# Patient Record
Sex: Female | Born: 1980 | Race: White | Hispanic: No | Marital: Married | Smoking: Current every day smoker
Health system: Southern US, Community
[De-identification: ages and names within clinical notes are randomized; demographics above are authoritative.]

## PROBLEM LIST (undated history)

## (undated) DIAGNOSIS — E2749 Other adrenocortical insufficiency: Secondary | ICD-10-CM

## (undated) DIAGNOSIS — F419 Anxiety disorder, unspecified: Secondary | ICD-10-CM

## (undated) DIAGNOSIS — Z91410 Personal history of adult physical and sexual abuse: Secondary | ICD-10-CM

## (undated) DIAGNOSIS — J309 Allergic rhinitis, unspecified: Secondary | ICD-10-CM

## (undated) DIAGNOSIS — K219 Gastro-esophageal reflux disease without esophagitis: Secondary | ICD-10-CM

## (undated) HISTORY — DX: Allergic rhinitis, unspecified: J30.9

## (undated) HISTORY — DX: Personal history of adult physical and sexual abuse: Z91.410

## (undated) HISTORY — PX: ELBOW SURGERY: SHX618

## (undated) HISTORY — DX: Other adrenocortical insufficiency: E27.49

## (undated) HISTORY — PX: FINGER SURGERY: SHX640

## (undated) HISTORY — DX: Gastro-esophageal reflux disease without esophagitis: K21.9

## (undated) HISTORY — PX: CHOLECYSTECTOMY: SHX55

---

## 2016-10-16 LAB — BASIC METABOLIC PANEL
BUN: 8 (ref 4–21)
Creatinine: 0.8 (ref 0.5–1.1)
Glucose: 91
Potassium: 3.3 — AB (ref 3.4–5.3)
Sodium: 139 (ref 137–147)

## 2016-10-16 LAB — VITAMIN D 25 HYDROXY (VIT D DEFICIENCY, FRACTURES): Vit D, 25-Hydroxy: 26

## 2016-10-16 LAB — HEPATIC FUNCTION PANEL
ALT: 23 (ref 7–35)
AST: 16 (ref 13–35)
Alkaline Phosphatase: 84 (ref 25–125)
Bilirubin, Total: 0.6

## 2016-10-16 LAB — LIPID PANEL
Cholesterol: 153 (ref 0–200)
HDL: 35 (ref 35–70)
LDL Cholesterol: 92
Triglycerides: 130 (ref 40–160)

## 2016-10-16 LAB — VITAMIN B12: Vitamin B-12: 388

## 2016-10-16 LAB — TSH: TSH: 2 (ref 0.41–5.90)

## 2017-04-10 ENCOUNTER — Ambulatory Visit: Payer: Self-pay | Admitting: Family Medicine

## 2017-05-01 ENCOUNTER — Ambulatory Visit: Payer: Self-pay | Admitting: Family Medicine

## 2017-05-07 ENCOUNTER — Ambulatory Visit: Payer: Self-pay | Admitting: Family Medicine

## 2017-06-07 ENCOUNTER — Ambulatory Visit: Payer: Self-pay | Admitting: Family Medicine

## 2017-07-09 ENCOUNTER — Other Ambulatory Visit: Payer: Self-pay

## 2017-07-09 ENCOUNTER — Observation Stay
Admission: EM | Admit: 2017-07-09 | Discharge: 2017-07-10 | Disposition: A | Discharge status: Home / Self Care | Payer: Self-pay | Attending: Internal Medicine | Admitting: Internal Medicine

## 2017-07-09 ENCOUNTER — Encounter: Payer: Self-pay | Admitting: Emergency Medicine

## 2017-07-09 ENCOUNTER — Emergency Department: Payer: Self-pay

## 2017-07-09 DIAGNOSIS — Z79899 Other long term (current) drug therapy: Secondary | ICD-10-CM | POA: Insufficient documentation

## 2017-07-09 DIAGNOSIS — R109 Unspecified abdominal pain: Secondary | ICD-10-CM | POA: Insufficient documentation

## 2017-07-09 DIAGNOSIS — E2749 Other adrenocortical insufficiency: Principal | ICD-10-CM | POA: Diagnosis present

## 2017-07-09 DIAGNOSIS — Z888 Allergy status to other drugs, medicaments and biological substances status: Secondary | ICD-10-CM | POA: Insufficient documentation

## 2017-07-09 DIAGNOSIS — F419 Anxiety disorder, unspecified: Secondary | ICD-10-CM | POA: Insufficient documentation

## 2017-07-09 DIAGNOSIS — Z9049 Acquired absence of other specified parts of digestive tract: Secondary | ICD-10-CM | POA: Insufficient documentation

## 2017-07-09 DIAGNOSIS — Z8249 Family history of ischemic heart disease and other diseases of the circulatory system: Secondary | ICD-10-CM | POA: Insufficient documentation

## 2017-07-09 DIAGNOSIS — R112 Nausea with vomiting, unspecified: Secondary | ICD-10-CM | POA: Insufficient documentation

## 2017-07-09 DIAGNOSIS — Z9851 Tubal ligation status: Secondary | ICD-10-CM | POA: Insufficient documentation

## 2017-07-09 HISTORY — DX: Anxiety disorder, unspecified: F41.9

## 2017-07-09 LAB — CBC
HCT: 42.3 % (ref 35.0–47.0)
Hemoglobin: 14.1 g/dL (ref 12.0–16.0)
MCH: 30 pg (ref 26.0–34.0)
MCHC: 33.4 g/dL (ref 32.0–36.0)
MCV: 89.8 fL (ref 80.0–100.0)
Platelets: 269 10*3/uL (ref 150–440)
RBC: 4.71 MIL/uL (ref 3.80–5.20)
RDW: 14.7 % — ABNORMAL HIGH (ref 11.5–14.5)
WBC: 14.5 10*3/uL — ABNORMAL HIGH (ref 3.6–11.0)

## 2017-07-09 LAB — BASIC METABOLIC PANEL
Anion gap: 10 (ref 5–15)
BUN: 11 mg/dL (ref 6–20)
CO2: 27 mmol/L (ref 22–32)
Calcium: 9.1 mg/dL (ref 8.9–10.3)
Chloride: 98 mmol/L — ABNORMAL LOW (ref 101–111)
Creatinine, Ser: 0.76 mg/dL (ref 0.44–1.00)
GFR calc Af Amer: >60 mL/min (ref >60)
GFR calc non Af Amer: >60 mL/min (ref >60)
Glucose, Bld: 97 mg/dL (ref 65–99)
Potassium: 3.1 mmol/L — ABNORMAL LOW (ref 3.5–5.1)
Sodium: 135 mmol/L (ref 135–145)

## 2017-07-09 LAB — URINALYSIS, COMPLETE (UACMP) WITH MICROSCOPIC
Bilirubin Urine: NEGATIVE
Glucose, UA: NEGATIVE mg/dL
Hgb urine dipstick: NEGATIVE
Ketones, ur: NEGATIVE mg/dL
Leukocytes, UA: NEGATIVE
Nitrite: NEGATIVE
Protein, ur: NEGATIVE mg/dL
Specific Gravity, Urine: 1.01 (ref 1.005–1.030)
pH: 6 (ref 5.0–8.0)

## 2017-07-09 LAB — LIPASE, BLOOD: Lipase: 23 U/L (ref 11–51)

## 2017-07-09 LAB — PREGNANCY, URINE: Preg Test, Ur: NEGATIVE

## 2017-07-09 LAB — TROPONIN I: Troponin I: <0.03 ng/mL (ref <0.03)

## 2017-07-09 MED ORDER — SODIUM CHLORIDE 0.9 % IV BOLUS
1000.0000 mL | Freq: Once | INTRAVENOUS | Status: AC
  Administered 2017-07-09: 1000 mL via INTRAVENOUS

## 2017-07-09 MED ORDER — IOPAMIDOL (ISOVUE-370) INJECTION 76%
75.0000 mL | Freq: Once | INTRAVENOUS | Status: AC | PRN
  Administered 2017-07-09: 75 mL via INTRAVENOUS

## 2017-07-09 MED ORDER — TRAMADOL HCL 50 MG PO TABS: 50.0000 mg | ORAL_TABLET | Freq: Four times a day (QID) | ORAL | Status: DC | PRN

## 2017-07-09 MED ORDER — ACETAMINOPHEN 325 MG PO TABS
650.0000 mg | ORAL_TABLET | Freq: Four times a day (QID) | ORAL | Status: DC | PRN
  Administered 2017-07-09 – 2017-07-10 (×2): 650 mg via ORAL
  Filled 2017-07-09 (×2): qty 2

## 2017-07-09 MED ORDER — BUSPIRONE HCL 5 MG PO TABS
10.0000 mg | ORAL_TABLET | Freq: Three times a day (TID) | ORAL | Status: DC
  Filled 2017-07-09 (×2): qty 1

## 2017-07-09 MED ORDER — ACETAMINOPHEN 650 MG RE SUPP: 650.0000 mg | Freq: Four times a day (QID) | RECTAL | Status: DC | PRN

## 2017-07-09 MED ORDER — ALBUTEROL SULFATE (2.5 MG/3ML) 0.083% IN NEBU: 2.5000 mg | INHALATION_SOLUTION | RESPIRATORY_TRACT | Status: DC | PRN

## 2017-07-09 MED ORDER — BUSPIRONE HCL 5 MG PO TABS
10.0000 mg | ORAL_TABLET | Freq: Three times a day (TID) | ORAL | Status: DC
  Administered 2017-07-09: 10 mg via ORAL
  Filled 2017-07-09 (×2): qty 2

## 2017-07-09 MED ORDER — POLYETHYLENE GLYCOL 3350 17 G PO PACK: 17.0000 g | PACK | Freq: Every day | ORAL | Status: DC | PRN

## 2017-07-09 MED ORDER — ONDANSETRON HCL 4 MG/2ML IJ SOLN
4.0000 mg | Freq: Once | INTRAMUSCULAR | Status: AC
  Administered 2017-07-09: 4 mg via INTRAVENOUS
  Filled 2017-07-09: qty 2

## 2017-07-09 MED ORDER — SODIUM CHLORIDE 0.9% FLUSH
3.0000 mL | Freq: Two times a day (BID) | INTRAVENOUS | Status: DC
  Administered 2017-07-09 – 2017-07-10 (×3): 3 mL via INTRAVENOUS

## 2017-07-09 MED ORDER — MORPHINE SULFATE (PF) 4 MG/ML IV SOLN
4.0000 mg | Freq: Once | INTRAVENOUS | Status: AC
Start: 2017-07-09 — End: 2017-07-09
  Administered 2017-07-09: 4 mg via INTRAVENOUS
  Filled 2017-07-09: qty 1

## 2017-07-09 MED ORDER — ONDANSETRON HCL 4 MG/2ML IJ SOLN: 4.0000 mg | Freq: Four times a day (QID) | INTRAMUSCULAR | Status: DC | PRN

## 2017-07-09 MED ORDER — POTASSIUM CHLORIDE CRYS ER 20 MEQ PO TBCR
40.0000 meq | EXTENDED_RELEASE_TABLET | ORAL | Status: AC
  Administered 2017-07-09 (×2): 40 meq via ORAL
  Filled 2017-07-09 (×2): qty 2

## 2017-07-09 MED ORDER — ONDANSETRON HCL 4 MG PO TABS: 4.0000 mg | ORAL_TABLET | Freq: Four times a day (QID) | ORAL | Status: DC | PRN

## 2017-07-09 MED ORDER — MORPHINE SULFATE (PF) 2 MG/ML IV SOLN: 2.0000 mg | INTRAVENOUS | Status: DC | PRN

## 2017-07-09 NOTE — H&P (Signed)
[facility] - [place] at [facility]   PATIENT NAME: Evelyn Webster    MR#:  [id number]  DATE OF BIRTH:  1980-08-22  DATE OF ADMISSION:  07/09/2017  PRIMARY CARE PHYSICIAN: Patient, No Pcp Per   REQUESTING/REFERRING PHYSICIAN: Dr. Pershing Proud  CHIEF COMPLAINT:   Chief Complaint  Patient presents with  . Abdominal Pain  . Flank Pain  . Emesis    HISTORY OF PRESENT ILLNESS:  Evelyn Webster  is a 37 y.o. female with a known history of anxiety presents to the hospital with complaints of 4 days of abdominal pain and vomiting.  Patient was seen in [facility] on Saturday and was started on Bentyl and Zofran and discharged home.  Due to worsening pain patient presented to the emergency room.  Due to elevated WBC count left foot patient had CT scan of the abdomen and pelvis which shows bilateral adrenal hemorrhages.  No other acute findings.  Patient is being admitted for observation.  No signs of hypotension or hypoglycemia at this time. No recent trauma or infections or travel.  Not on anticoagulants.  No IV drug use.  PAST MEDICAL HISTORY:   Past Medical History:  Diagnosis Date  . Anxiety     PAST SURGICAL HISTORY:   Past Surgical History:  Procedure Laterality Date  . CESAREAN SECTION    . CHOLECYSTECTOMY      SOCIAL HISTORY:   Social History   Tobacco Use  . Smoking status: Not on file  Substance Use Topics  . Alcohol use: Not on file    FAMILY HISTORY:   Family History  Problem Relation Age of Onset  . Hypertension Other     DRUG ALLERGIES:   Allergies  Allergen Reactions  . Wellbutrin [Bupropion] Rash    REVIEW OF SYSTEMS:   Review of Systems  Constitutional: Positive for malaise/fatigue. Negative for chills and fever.  HENT: Negative for sore throat.   Eyes: Negative for blurred vision, double vision and pain.  Respiratory: Negative for cough, hemoptysis, shortness of breath and wheezing.   Cardiovascular: Negative for chest pain,  palpitations, orthopnea and leg swelling.  Gastrointestinal: Positive for abdominal pain, nausea and vomiting. Negative for constipation, diarrhea and heartburn.  Genitourinary: Negative for dysuria and hematuria.  Musculoskeletal: Negative for back pain and joint pain.  Skin: Negative for rash.  Neurological: Negative for sensory change, speech change, focal weakness and headaches.  Endo/Heme/Allergies: Does not bruise/bleed easily.  Psychiatric/Behavioral: Negative for depression. The patient is nervous/anxious.     MEDICATIONS AT HOME:   Prior to Admission medications   Medication Sig Start Date End Date Taking? Authorizing Provider  busPIRone (BUSPAR) 10 MG tablet Take 10 mg by mouth 3 (three) times daily. 06/07/17  Yes [provider]  dicyclomine (BENTYL) 20 MG tablet Take 20 mg by mouth 2 (two) times daily. 07/07/17 07/17/17 Yes [provider]  ondansetron (ZOFRAN-ODT) 4 MG disintegrating tablet Take 4 mg by mouth every 8 (eight) hours as needed for nausea/vomiting. 07/07/17 07/14/17 Yes [provider]     VITAL SIGNS:  Blood pressure 119/79, pulse 71, temperature 98.1 F (36.7 C), temperature source Oral, resp. rate 13, height 5\' 1"  (1.549 m), weight 99.8 kg (220 lb), last menstrual period 06/19/2017, SpO2 96 %.  PHYSICAL EXAMINATION:  Physical Exam  GENERAL:  37 y.o.-year-old patient lying in the bed with no acute distress.  EYES: Pupils equal, round, reactive to light and accommodation. No scleral icterus. Extraocular muscles intact.  HEENT: Head atraumatic, normocephalic.  Oropharynx and nasopharynx clear. No oropharyngeal erythema, moist oral mucosa  NECK:  Supple, no jugular venous distention. No thyroid enlargement, no tenderness.  LUNGS: Normal breath sounds bilaterally, no wheezing, rales, rhonchi. No use of accessory muscles of respiration.  CARDIOVASCULAR: S1, S2 normal. No murmurs, rubs, or gallops.  ABDOMEN: Soft, tenderness diffusely,  nondistended. Bowel sounds present. No organomegaly or mass.  EXTREMITIES: No pedal edema, cyanosis, or clubbing. + 2 pedal & radial pulses b/l.   NEUROLOGIC: Cranial nerves II through XII are intact. No focal Motor or sensory deficits appreciated b/l PSYCHIATRIC: The patient is alert and oriented x 3. Good affect.  SKIN: No obvious rash, lesion, or ulcer.   LABORATORY PANEL:   CBC Recent Labs  Lab 07/09/17 0944  WBC 14.5*  HGB 14.1  HCT 42.3  PLT 269   ------------------------------------------------------------------------------------------------------------------  Chemistries  Recent Labs  Lab 07/09/17 0944  NA 135  K 3.1*  CL 98*  CO2 27  GLUCOSE 97  BUN 11  CREATININE 0.76  CALCIUM 9.1   ------------------------------------------------------------------------------------------------------------------  Cardiac Enzymes Recent Labs  Lab 07/09/17 0944  TROPONINI <0.03   ------------------------------------------------------------------------------------------------------------------  RADIOLOGY:  Ct Abdomen Pelvis W Contrast  Result Date: 07/09/2017 CLINICAL DATA:  Mid abdominal pain EXAM: CT ABDOMEN AND PELVIS WITH CONTRAST TECHNIQUE: Multidetector CT imaging of the abdomen and pelvis was performed using the standard protocol following bolus administration of intravenous contrast. CONTRAST:  75mL [id number] COMPARISON:  None. FINDINGS: Lower chest: No acute abnormality. Hepatobiliary: No focal liver abnormality is seen. Status post cholecystectomy. No biliary dilatation. Pancreas: Unremarkable. No pancreatic ductal dilatation or surrounding inflammatory changes. Spleen: Normal in size without focal abnormality. Adrenals/Urinary Tract: The right adrenal gland demonstrates hyperplasia. Some mild stranding is noted adjacent to the right adrenal gland. The left adrenal gland demonstrates a focal mass lesion measuring approximately 4.5 cm. Some central areas of decreased  attenuation are identified within the mass as well as some inflammatory change surrounding the adrenal gland and extending inferiorly along the left kidney and Gerota's fascia. The kidneys are within normal limits without obstructive change. The bladder is decompressed. Stomach/Bowel: The appendix is not visualized and may have been surgically removed. Correlation with patient's history is recommended. No obstructive or inflammatory changes of the bowel are seen. Vascular/Lymphatic: No significant vascular findings are present. No enlarged abdominal or pelvic lymph nodes. Reproductive: Uterus and bilateral adnexa are unremarkable. Changes of prior tubal ligation are seen. Other: No abdominal wall hernia or abnormality. No abdominopelvic ascites. Musculoskeletal: No acute or significant osseous findings. IMPRESSION: Changes involving the adrenal glands bilaterally with some periadrenal stranding and significant enlargement of the left adrenal gland. This likely represents acute adrenal hemorrhage. The possibility of underlying mass deserves consideration and follow-up MRI examination in 4-6 weeks may be helpful. Additionally correlation with adrenal laboratory values is recommended to assess for the degree of adrenal function. No other focal abnormality is seen. Critical Value/emergent results were called by telephone at the time of interpretation on 07/09/2017 at 2:17 pm to Dr. Lamont Snowball , who verbally acknowledged these results. Electronically Signed   By: Alcide Clever M.D.   On: 07/09/2017 14:17     IMPRESSION AND PLAN:   * Adrenal hemorrhage, bilateral Etiology unclear.  No signs of infection or trauma or anticoagulants.  Will check blood cultures.  We will also check cortisol level.  Monitor her for hypotension or adrenal insufficiency.  Check HIV.  Vascular surgery consulted from emergency room.  Monitor.  If any worsening will need  embolization. Need close outpatient follow-up even after discharge for  adrenal insufficiency.  *Abdominal pain and nausea likely due to adrenal hemorrhage.  Anxiety.  Continue medications.  DVT prophylaxis with SCDs  All the records are reviewed and case discussed with ED provider. Management plans discussed with the patient, family and they are in agreement.  CODE STATUS: FULL CODE  TOTAL TIME TAKING CARE OF THIS PATIENT: 40 minutes.   Molinda Bailiff Asani Deniston M.D on 07/09/2017 at 3:27 PM  Between 7am to 6pm - Pager - [phone]  After 6pm go to [email or web address] - password [email or web address]  [facility] [place] [facility]  Office  [phone]  CC: Primary care physician; Patient, No Pcp Per  Note: This dictation was prepared with Dragon dictation along with smaller phrase technology. Any transcriptional errors that result from this process are unintentional.

## 2017-07-09 NOTE — ED Triage Notes (Signed)
Pt reports was seen at a hospital this weekend for mid epigastric pain and was given medication. Pt reports now with pain to left flank and NV.

## 2017-07-09 NOTE — ED Notes (Addendum)
Pt c/o mid abd pain that started Friday and persisted into Saturday with N/V - yesterday she started with mid abd pain that radiated into left lower abd - denies diarrhea - reports hx of heartburn being tx'd with medication daily - reports that when she vomits it is a "hot liquid" with burning sensation

## 2017-07-09 NOTE — ED Provider Notes (Addendum)
[facility] Emergency Department Provider Note  ____________________________________________   First MD Initiated Contact with Patient 07/09/17 1110     (approximate)  I have reviewed the triage vital signs and the nursing notes.   HISTORY  Chief Complaint Abdominal Pain; Flank Pain; and Emesis   HPI Evelyn Webster is a 37 y.o. female history of cholecystectomy who presented to an outside emergency department this past Saturday for epigastric abdominal pain was presented to the emergency department today with left flank pain.  Says the pain is sharp and crampy and is a 4-6 out of 10 at this time.  She says that she has nausea and is vomiting thin fluids but not any solids.  Denies any diarrhea.  Was discharged with a diagnosis of "virus" with prescription for dicyclomine, Zofran and Maalox.  However, the pain has migrated down to the left flank.  Patient says that she was in severe pain yesterday and is returning today because of the migration of the pain in addition to the worsening.  She denies any dysuria.  Denies any history of kidney stones.  History reviewed. No pertinent past medical history.  There are no active problems to display for this patient.   Past Surgical History:  Procedure Laterality Date  . CESAREAN SECTION    . CHOLECYSTECTOMY      Prior to Admission medications   Medication Sig Start Date End Date Taking? Authorizing Provider  busPIRone (BUSPAR) 10 MG tablet Take 10 mg by mouth 3 (three) times daily. 06/07/17  Yes [provider]  dicyclomine (BENTYL) 20 MG tablet Take 20 mg by mouth 2 (two) times daily. 07/07/17 07/17/17 Yes [provider]  ondansetron (ZOFRAN-ODT) 4 MG disintegrating tablet Take 4 mg by mouth every 8 (eight) hours as needed for nausea/vomiting. 07/07/17 07/14/17 Yes [provider]    Allergies Wellbutrin [bupropion]  No family history on file.  Social History Social History   Tobacco  Use  . Smoking status: Not on file  Substance Use Topics  . Alcohol use: Not on file  . Drug use: Not on file    Review of Systems  Constitutional: No fever/chills Eyes: No visual changes. ENT: No sore throat. Cardiovascular: Denies chest pain. Respiratory: Denies shortness of breath. Gastrointestinal:no vomiting.  No diarrhea.  No constipation. Genitourinary: Negative for dysuria. Musculoskeletal: Negative for back pain. Skin: Negative for rash. Neurological: Negative for headaches, focal weakness or numbness.   ____________________________________________   PHYSICAL EXAM:  VITAL SIGNS: ED Triage Vitals [07/09/17 0932]  Enc Vitals Group     BP (!) 131/96     Pulse Rate (!) 104     Resp 20     Temp 98.1 F (36.7 C)     Temp Source Oral     SpO2 100 %     Weight 220 lb (99.8 kg)     Height  (1.549 m)     Head Circumference      Peak Flow      Pain Score 4     Pain Loc      Pain Edu?      Excl. in GC?     Constitutional: Alert and oriented. Well appearing and in no acute distress. Eyes: Conjunctivae are normal.  Head: Atraumatic. Nose: No congestion/rhinnorhea. Mouth/Throat: Mucous membranes are moist.  Neck: No stridor.   Cardiovascular: Normal rate, regular rhythm. Grossly normal heart sounds.  Good peripheral circulation. Respiratory: Normal respiratory effort.  No retractions. Lungs CTAB. Gastrointestinal:  Soft with mild left epigastric, left upper quadrant as well as left lower quadrant tenderness.  There is no right lower quadrant or right upper quadrant tenderness to palpation. No distention. No CVA tenderness. Musculoskeletal: No lower extremity tenderness nor edema.  No joint effusions. Neurologic:  Normal speech and language. No gross focal neurologic deficits are appreciated. Skin:  Skin is warm, dry and intact. No rash noted. Psychiatric: Mood and affect are normal. Speech and behavior are  normal.  ____________________________________________   LABS (all labs ordered are listed, but only abnormal results are displayed)  Labs Reviewed  URINALYSIS, COMPLETE (UACMP) WITH MICROSCOPIC - Abnormal; Notable for the following components:      Result Value   Color, Urine YELLOW (*)    APPearance HAZY (*)    Bacteria, UA MANY (*)    All other components within normal limits  BASIC METABOLIC PANEL - Abnormal; Notable for the following components:   Potassium 3.1 (*)    Chloride 98 (*)    All other components within normal limits  CBC - Abnormal; Notable for the following components:   WBC 14.5 (*)    RDW 14.7 (*)    All other components within normal limits  LIPASE, BLOOD  TROPONIN I  PREGNANCY, URINE   ____________________________________________  EKG  ED ECG REPORT I, Arelia Longest, the attending physician, personally viewed and interpreted this ECG.   Date: 07/09/2017  EKG Time: 1202  Rate: 81  Rhythm: normal sinus rhythm  Axis: Normal  Intervals:none  ST&T Change: No ST segment elevation or depression.  No abnormal T wave inversion.  ____________________________________________  RADIOLOGY  Patient with bilateral adrenal hemorrhages. ____________________________________________   PROCEDURES  Procedure(s) performed:   Procedures  Critical Care performed:   ____________________________________________   INITIAL IMPRESSION / ASSESSMENT AND PLAN / ED COURSE  Pertinent labs & imaging results that were available during my care of the patient were reviewed by me and considered in my medical decision making (see chart for details).  Differential diagnosis includes, but is not limited to, ovarian cyst, ovarian torsion, acute appendicitis, diverticulitis, urinary tract infection/pyelonephritis, endometriosis, bowel obstruction, colitis, renal colic, gastroenteritis, hernia, fibroids, endometriosis, pregnancy related pain including ectopic pregnancy,  etc. As part of my medical decision making, I reviewed the following data within the electronic MEDICAL RECORD NUMBERI reviewed the patient's medical records from this past Saturday's visit which included a negative pregnancy test.  White blood cell count at that point was 17.9.  It is down to 14 today.  ----------------------------------------- 3:09 PM on 07/09/2017 -----------------------------------------  Patient without distress at this time.  Notified her of the CT findings.  I also discussed the case with Dr. Wyn Quaker of the vascular surgery who recommends admission with serial hemoglobins.  He says that if the patient were to deteriorate with a hemoglobin were to drop that he could embolize the adrenal glands.  Signed out to Dr. Cherlynn Kaiser.  Unclear etiology of the patient's adrenal hemorrhage.  No history of trauma.  No recent sepsis/meningococcemia. ____________________________________________   FINAL CLINICAL IMPRESSION(S) / ED DIAGNOSES  Adrenal gland hemorrhage.    NEW MEDICATIONS STARTED DURING THIS VISIT:  New Prescriptions   No medications on file     Note:  This document was prepared using Dragon voice recognition software and may include unintentional dictation errors.     Myrna Blazer, MD 07/09/17 1510    Pershing Proud, Myra Rude, MD 07/09/17 [phone]

## 2017-07-09 NOTE — ED Notes (Signed)
Attempted IV in right upper arm x1 without success

## 2017-07-09 NOTE — ED Notes (Signed)
Pt given water to drink. 

## 2017-07-09 NOTE — ED Notes (Signed)
Lab called to check on urine preg - they had not started to process it yet and stated they would run it now

## 2017-07-10 LAB — CBC
HCT: 36.3 % (ref 35.0–47.0)
Hemoglobin: 12.2 g/dL (ref 12.0–16.0)
MCH: 30.2 pg (ref 26.0–34.0)
MCHC: 33.5 g/dL (ref 32.0–36.0)
MCV: 90.1 fL (ref 80.0–100.0)
Platelets: 242 10*3/uL (ref 150–440)
RBC: 4.03 MIL/uL (ref 3.80–5.20)
RDW: 14.3 % (ref 11.5–14.5)
WBC: 8.5 10*3/uL (ref 3.6–11.0)

## 2017-07-10 LAB — HEMOGLOBIN: Hemoglobin: 12.4 g/dL (ref 12.0–16.0)

## 2017-07-10 LAB — BASIC METABOLIC PANEL
Anion gap: 6 (ref 5–15)
BUN: 11 mg/dL (ref 6–20)
CO2: 26 mmol/L (ref 22–32)
Calcium: 8.3 mg/dL — ABNORMAL LOW (ref 8.9–10.3)
Chloride: 107 mmol/L (ref 101–111)
Creatinine, Ser: 0.83 mg/dL (ref 0.44–1.00)
GFR calc Af Amer: >60 mL/min (ref >60)
GFR calc non Af Amer: >60 mL/min (ref >60)
Glucose, Bld: 121 mg/dL — ABNORMAL HIGH (ref 65–99)
Potassium: 3.7 mmol/L (ref 3.5–5.1)
Sodium: 139 mmol/L (ref 135–145)

## 2017-07-10 LAB — CORTISOL: Cortisol, Plasma: 6.8 ug/dL

## 2017-07-10 MED ORDER — ACETAMINOPHEN 325 MG PO TABS: 650.0000 mg | ORAL_TABLET | Freq: Four times a day (QID) | ORAL | 0 refills | Status: AC | PRN

## 2017-07-10 MED ORDER — BUSPIRONE HCL 5 MG PO TABS
5.0000 mg | ORAL_TABLET | Freq: Three times a day (TID) | ORAL | Status: DC
  Administered 2017-07-10: 5 mg via ORAL
  Filled 2017-07-10 (×3): qty 1

## 2017-07-10 NOTE — Discharge Instructions (Signed)
Check blood sugar and Blood pressure daily at home and visit PMD in 1 week.

## 2017-07-10 NOTE — Progress Notes (Signed)
Pt discharged per MD order. IV removed. Discharge instructions reviewed with pt and pt verbalized understanding. Work note given to pt. Pt taken to car in wheelchair by staff.

## 2017-07-10 NOTE — Discharge Summary (Signed)
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 Anxiety     HOSPITAL COURSE:   Patient was admitted for bilateral adrenal bleed and pain. Her hemoglobin remained stable on further follow ups. She required tylenol tablets twice since last night. I spoke to vascular surgeon on call, he had suggested- to have follow-up in clinic in 1 month. Patient's blood sugar and blood pressure is stable. I advised to keep checking her blood sugar and blood pressure at home every day. Follow-up with primary care physician in 1 week.  DISCHARGE CONDITIONS:   Stable.  CONSULTS OBTAINED:  Treatment Team:  Renford Dills, MD  DRUG ALLERGIES:   Allergies  Allergen Reactions  . Wellbutrin [Bupropion] Rash    DISCHARGE MEDICATIONS:   Allergies as of 07/10/2017      Reactions   Wellbutrin [bupropion] Rash      Medication List    TAKE these medications   acetaminophen 325 MG tablet Commonly known as:  TYLENOL Take 2 tablets (650 mg total) by mouth every 6 (six) hours as needed for mild pain (or Fever >/= 101).   busPIRone 10 MG tablet Commonly known as:  BUSPAR Take 10 mg by mouth 3 (three) times daily.   dicyclomine 20 MG tablet Commonly known as:  BENTYL Take 20 mg by mouth 2 (two) times daily.   ondansetron 4 MG disintegrating tablet Commonly known as:  ZOFRAN-ODT Take 4 mg by mouth every 8 (eight) hours as needed for nausea/vomiting.        DISCHARGE INSTRUCTIONS:    Follow with PMD and vascular clinic in 1 week.  If you experience  worsening of your admission symptoms, develop shortness of breath, life threatening emergency, suicidal or homicidal thoughts you must seek medical attention immediately by calling 911 or calling your MD immediately  if symptoms less severe.  You Must read complete instructions/literature along with all the possible adverse reactions/side effects for all the Medicines you take and that have been prescribed to you. Take any new Medicines after you have completely understood and accept all the possible adverse reactions/side effects.   Please note  You were cared for by a hospitalist during your hospital stay. If you have any questions about your discharge medications or the care you received while you were in the hospital after you are discharged, you can call the unit and asked to speak with the hospitalist on call if the hospitalist that took care of you is not available. Once you are discharged, your primary care physician will handle any further medical issues. Please note that NO REFILLS for any discharge medications will be authorized once you are discharged, as it is imperative that you return to your primary care physician (or establish a relationship with a primary care physician if you do not have one) for your aftercare needs so that they can reassess your need for medications and monitor your lab values.    Today   CHIEF COMPLAINT:   Chief Complaint  Patient presents with  . Abdominal Pain  . Flank  Pain  . Emesis    HISTORY OF PRESENT ILLNESS:  Evelyn Webster  is a 37 y.o. female with complaints of 4 days of abdominal pain and vomiting.  Patient was seen in [facility] on Saturday and was started on Bentyl and Zofran and discharged home.  Due to worsening pain patient presented to the emergency room.  Due to elevated WBC count left foot patient had CT scan of the abdomen and pelvis which shows bilateral adrenal hemorrhages.  No other acute findings.  Patient is being admitted for  observation.  No signs of hypotension or hypoglycemia at this time. No recent trauma or infections or travel.  Not on anticoagulants.  No IV drug use.   VITAL SIGNS:  Blood pressure 106/86, pulse 62, temperature 98.2 F (36.8 C), resp. rate 17, height  (1.549 m), weight 99.8 kg (220 lb), last menstrual period 07/09/2017, SpO2 98 %.  I/O:    Intake/Output Summary (Last 24 hours) at 07/10/2017 1455 Last data filed at 07/10/2017 0400 Gross per 24 hour  Intake 520 ml  Output 500 ml  Net 20 ml    PHYSICAL EXAMINATION:  GENERAL:  38 y.o.-year-old patient lying in the bed with no acute distress.  EYES: Pupils equal, round, reactive to light and accommodation. No scleral icterus. Extraocular muscles intact.  HEENT: Head atraumatic, normocephalic. Oropharynx and nasopharynx clear.  NECK:  Supple, no jugular venous distention. No thyroid enlargement, no tenderness.  LUNGS: Normal breath sounds bilaterally, no wheezing, rales,rhonchi or crepitation. No use of accessory muscles of respiration.  CARDIOVASCULAR: S1, S2 normal. No murmurs, rubs, or gallops.  ABDOMEN: Soft, non-tender, non-distended. Bowel sounds present. No organomegaly or mass.  EXTREMITIES: No pedal edema, cyanosis, or clubbing.  NEUROLOGIC: Cranial nerves II through XII are intact. Muscle strength 5/5 in all extremities. Sensation intact. Gait not checked.  PSYCHIATRIC: The patient is alert and oriented x 3.  SKIN: No obvious rash, lesion, or ulcer.   DATA REVIEW:   CBC Recent Labs  Lab 07/10/17 0616 07/10/17 1136  WBC 8.5  --   HGB 12.2 12.4  HCT 36.3  --   PLT 242  --     Chemistries  Recent Labs  Lab 07/10/17 0616  NA 139  K 3.7  CL 107  CO2 26  GLUCOSE 121*  BUN 11  CREATININE 0.83  CALCIUM 8.3*    Cardiac Enzymes Recent Labs  Lab 07/09/17 0944  TROPONINI <0.03    Microbiology Results  Results for orders placed or performed during the hospital encounter of 07/09/17  CULTURE, BLOOD (ROUTINE  X 2) w Reflex to ID Panel     Status: None (Preliminary result)   Collection Time: 07/09/17  7:25 PM  Result Value Ref Range Status   Specimen Description BLOOD BLOOD RIGHT HAND  Final   Special Requests   Final    BOTTLES DRAWN AEROBIC AND ANAEROBIC Blood Culture results may not be optimal due to an inadequate volume of blood received in culture bottles   Culture   Final    NO GROWTH < 12 HOURS Performed at [facility], [address] [place], [place] [address]    Report Status PENDING  Incomplete  CULTURE, BLOOD (ROUTINE X 2) w Reflex to ID Panel     Status: None (Preliminary result)   Collection Time: 07/09/17  7:34 PM  Result Value Ref Range Status   Specimen Description BLOOD BLOOD LEFT FOREARM  Final   Special Requests   Final  BOTTLES DRAWN AEROBIC AND ANAEROBIC Blood Culture results may not be optimal due to an inadequate volume of blood received in culture bottles   Culture   Final    NO GROWTH < 12 HOURS Performed at [facility], [address] [place], [place] [address]    Report Status PENDING  Incomplete    RADIOLOGY:  Ct Abdomen Pelvis W Contrast  Result Date: 07/09/2017 CLINICAL DATA:  Mid abdominal pain EXAM: CT ABDOMEN AND PELVIS WITH CONTRAST TECHNIQUE: Multidetector CT imaging of the abdomen and pelvis was performed using the standard protocol following bolus administration of intravenous contrast. CONTRAST:  75mL [id number] COMPARISON:  None. FINDINGS: Lower chest: No acute abnormality. Hepatobiliary: No focal liver abnormality is seen. Status post cholecystectomy. No biliary dilatation. Pancreas: Unremarkable. No pancreatic ductal dilatation or surrounding inflammatory changes. Spleen: Normal in size without focal abnormality. Adrenals/Urinary Tract: The right adrenal gland demonstrates hyperplasia. Some mild stranding is noted adjacent to the right adrenal gland. The left adrenal gland demonstrates a focal mass lesion measuring approximately  4.5 cm. Some central areas of decreased attenuation are identified within the mass as well as some inflammatory change surrounding the adrenal gland and extending inferiorly along the left kidney and Gerota's fascia. The kidneys are within normal limits without obstructive change. The bladder is decompressed. Stomach/Bowel: The appendix is not visualized and may have been surgically removed. Correlation with patient's history is recommended. No obstructive or inflammatory changes of the bowel are seen. Vascular/Lymphatic: No significant vascular findings are present. No enlarged abdominal or pelvic lymph nodes. Reproductive: Uterus and bilateral adnexa are unremarkable. Changes of prior tubal ligation are seen. Other: No abdominal wall hernia or abnormality. No abdominopelvic ascites. Musculoskeletal: No acute or significant osseous findings. IMPRESSION: Changes involving the adrenal glands bilaterally with some periadrenal stranding and significant enlargement of the left adrenal gland. This likely represents acute adrenal hemorrhage. The possibility of underlying mass deserves consideration and follow-up MRI examination in 4-6 weeks may be helpful. Additionally correlation with adrenal laboratory values is recommended to assess for the degree of adrenal function. No other focal abnormality is seen. Critical Value/emergent results were called by telephone at the time of interpretation on 07/09/2017 at 2:17 pm to Dr. Lamont Snowball , who verbally acknowledged these results. Electronically Signed   By: Alcide Clever M.D.   On: 07/09/2017 14:17    EKG:   Orders placed or performed during the hospital encounter of 07/09/17  . ED EKG  . ED EKG  . EKG 12-Lead  . EKG 12-Lead      Management plans discussed with the patient, family and they are in agreement.  CODE STATUS:     Code Status Orders  (From admission, onward)        Start     Ordered   07/09/17 1523  Full code  Continuous     07/09/17 1524     Code Status History    This patient has a current code status but no historical code status.      TOTAL TIME TAKING CARE OF THIS PATIENT: 35 minutes.    Altamese Dilling M.D on 07/10/2017 at 2:55 PM  Between 7am to 6pm - Pager - [phone]  After 6pm go to [email or web address] - password [email or web address]  [facility]  Office  [phone]  CC: Primary care physician; Patient, No Pcp Per   Note: This dictation was prepared with Dragon dictation along with smaller phrase technology. Any transcriptional errors that result from this process are  unintentional.

## 2017-07-10 NOTE — Progress Notes (Signed)
[facility]         [place], [place].   07/10/2017  Patient: Evelyn Webster   Date of Birth:  [birth year]  Date of admission:  07/09/2017  Date of Discharge  07/10/2017    To Whom it May Concern:   Evelyn Webster  may return to work on 07/13/17.  PHYSICAL ACTIVITY:  Full  If you have any questions or concerns, please don't hesitate to call.  Sincerely,   Altamese Dilling M.D Pager [phone] Office : [phone]   .

## 2017-07-11 LAB — HIV ANTIBODY (ROUTINE TESTING W REFLEX): HIV Screen 4th Generation wRfx: NONREACTIVE

## 2017-07-12 ENCOUNTER — Ambulatory Visit (INDEPENDENT_AMBULATORY_CARE_PROVIDER_SITE_OTHER): Payer: Self-pay | Admitting: Family Medicine

## 2017-07-12 VITALS — BP 120/79 | HR 77 | Temp 98.3°F | Ht 61.0 in | Wt 208.5 lb

## 2017-07-12 DIAGNOSIS — F410 Panic disorder [episodic paroxysmal anxiety] without agoraphobia: Secondary | ICD-10-CM | POA: Insufficient documentation

## 2017-07-12 DIAGNOSIS — D72829 Elevated white blood cell count, unspecified: Secondary | ICD-10-CM

## 2017-07-12 DIAGNOSIS — F411 Generalized anxiety disorder: Secondary | ICD-10-CM

## 2017-07-12 DIAGNOSIS — E2749 Other adrenocortical insufficiency: Secondary | ICD-10-CM

## 2017-07-12 DIAGNOSIS — R739 Hyperglycemia, unspecified: Secondary | ICD-10-CM

## 2017-07-12 LAB — BAYER DCA HB A1C WAIVED: HB A1C (BAYER DCA - WAIVED): 5.5 % (ref <7.0)

## 2017-07-12 MED ORDER — BUSPIRONE HCL 10 MG PO TABS: 10.0000 mg | ORAL_TABLET | Freq: Three times a day (TID) | ORAL | 2 refills | Status: DC

## 2017-07-12 NOTE — Patient Instructions (Signed)
Steps to Quit Smoking Smoking tobacco can be harmful to your health and can affect almost every organ in your body. Smoking puts you, and those around you, at risk for developing many serious chronic diseases. Quitting smoking is difficult, but it is one of the best things that you can do for your health. It is never too late to quit. What are the benefits of quitting smoking? When you quit smoking, you lower your risk of developing serious diseases and conditions, such as:  Lung cancer or lung disease, such as COPD.  Heart disease.  Stroke.  Heart attack.  Infertility.  Osteoporosis and bone fractures.  Additionally, symptoms such as coughing, wheezing, and shortness of breath may get better when you quit. You may also find that you get sick less often because your body is stronger at fighting off colds and infections. If you are pregnant, quitting smoking can help to reduce your chances of having a baby of low birth weight. How do I get ready to quit? When you decide to quit smoking, create a plan to make sure that you are successful. Before you quit:  Pick a date to quit. Set a date within the next two weeks to give you time to prepare.  Write down the reasons why you are quitting. Keep this list in places where you will see it often, such as on your bathroom mirror or in your car or wallet.  Identify the people, places, things, and activities that make you want to smoke (triggers) and avoid them. Make sure to take these actions: ? Throw away all cigarettes at home, at work, and in your car. ? Throw away smoking accessories, such as ashtrays and lighters. ? Clean your car and make sure to empty the ashtray. ? Clean your home, including curtains and carpets.  Tell your family, friends, and coworkers that you are quitting. Support from your loved ones can make quitting easier.  Talk with your health care provider about your options for quitting smoking.  Find out what treatment  options are covered by your health insurance.  What strategies can I use to quit smoking? Talk with your healthcare provider about different strategies to quit smoking. Some strategies include:  Quitting smoking altogether instead of gradually lessening how much you smoke over a period of time. Research shows that quitting "cold turkey" is more successful than gradually quitting.  Attending in-person counseling to help you build problem-solving skills. You are more likely to have success in quitting if you attend several counseling sessions. Even short sessions of 10 minutes can be effective.  Finding resources and support systems that can help you to quit smoking and remain smoke-free after you quit. These resources are most helpful when you use them often. They can include: ? Online chats with a counselor. ? Telephone quitlines. ? Printed self-help materials. ? Support groups or group counseling. ? Text messaging programs. ? Mobile phone applications.  Taking medicines to help you quit smoking. (If you are pregnant or breastfeeding, talk with your health care provider first.) Some medicines contain nicotine and some do not. Both types of medicines help with cravings, but the medicines that include nicotine help to relieve withdrawal symptoms. Your health care provider may recommend: ? Nicotine patches, gum, or lozenges. ? Nicotine inhalers or sprays. ? Non-nicotine medicine that is taken by mouth.  Talk with your health care provider about combining strategies, such as taking medicines while you are also receiving in-person counseling. Using these two strategies together   makes you more likely to succeed in quitting than if you used either strategy on its own. If you are pregnant or breastfeeding, talk with your health care provider about finding counseling or other support strategies to quit smoking. Do not take medicine to help you quit smoking unless told to do so by your health care  provider. What things can I do to make it easier to quit? Quitting smoking might feel overwhelming at first, but there is a lot that you can do to make it easier. Take these important actions:  Reach out to your family and friends and ask that they support and encourage you during this time. Call telephone quitlines, reach out to support groups, or work with a counselor for support.  Ask people who smoke to avoid smoking around you.  Avoid places that trigger you to smoke, such as bars, parties, or smoke-break areas at work.  Spend time around people who do not smoke.  Lessen stress in your life, because stress can be a smoking trigger for some people. To lessen stress, try: ? Exercising regularly. ? Deep-breathing exercises. ? Yoga. ? Meditating. ? Performing a body scan. This involves closing your eyes, scanning your body from head to toe, and noticing which parts of your body are particularly tense. Purposefully relax the muscles in those areas.  Download or purchase mobile phone or tablet apps (applications) that can help you stick to your quit plan by providing reminders, tips, and encouragement. There are many free apps, such as QuitGuide from the CDC (Centers for Disease Control and Prevention). You can find other support for quitting smoking (smoking cessation) through smokefree.gov and other websites.  How will I feel when I quit smoking? Within the first 24 hours of quitting smoking, you may start to feel some withdrawal symptoms. These symptoms are usually most noticeable 2-3 days after quitting, but they usually do not last beyond 2-3 weeks. Changes or symptoms that you might experience include:  Mood swings.  Restlessness, anxiety, or irritation.  Difficulty concentrating.  Dizziness.  Strong cravings for sugary foods in addition to nicotine.  Mild weight gain.  Constipation.  Nausea.  Coughing or a sore throat.  Changes in how your medicines work in your  body.  A depressed mood.  Difficulty sleeping (insomnia).  After the first 2-3 weeks of quitting, you may start to notice more positive results, such as:  Improved sense of smell and taste.  Decreased coughing and sore throat.  Slower heart rate.  Lower blood pressure.  Clearer skin.  The ability to breathe more easily.  Fewer sick days.  Quitting smoking is very challenging for most people. Do not get discouraged if you are not successful the first time. Some people need to make many attempts to quit before they achieve long-term success. Do your best to stick to your quit plan, and talk with your health care provider if you have any questions or concerns. This information is not intended to replace advice given to you by your health care provider. Make sure you discuss any questions you have with your health care provider. Document Released: 02/14/2001 Document Revised: 10/19/2015 Document Reviewed: 07/07/2014 Elsevier Interactive Patient Education  2018 [employer] Inc.  

## 2017-07-12 NOTE — Assessment & Plan Note (Signed)
Not under good control. Unclear if she has ever been on preventative medicine. Will obtain records from former PCP and get her back in 2-3 weeks for further discussion. Call with any concerns.

## 2017-07-12 NOTE — Progress Notes (Signed)
BP 120/79 (BP Location: Right Arm, Patient Position: Sitting, Cuff Size: Normal)   Pulse 77   Temp 98.3 F (36.8 C) (Oral)   Ht  (1.549 m)   Wt 208 lb 8 oz (94.6 kg)   LMP 07/09/2017   SpO2 99%   BMI 39.40 kg/m    Subjective:    Patient ID: Evelyn Webster, female    DOB: 02-Jul-1980, 37 y.o.   MRN: [id number]  HPI: Evelyn Webster is a 37 y.o. female who presents today to establish care, last saw a doctor within a year  Chief Complaint  Patient presents with  . Hospitalization Follow-up  . Establish Care   ER FOLLOW UP Time since discharge: 5 days Hospital/facility: [facility] ER Diagnosis: Abdominal Pain of unknown cause Procedures/tests: Labs- elevated WBC at 17.9, otherwise normal Consultants: None New medications: Toradol and GI cocktail Discharge instructions:  Follow up here Status: worse on Saturday  HOSPITAL FOLLOW UP- Returned to the ER with worsening abdominal pain that was radiating down to her L flank, diagnosed with bilateral adrenal bleed and pain. Hemoglobin remained stable. Tylenol controled pain.  Time since discharge: 3 days Hospital/facility: [facility] Diagnosis: Bilateral Adrenal hemorrhage Procedures/tests: labs (WBC down to 14), EKG, CT abdomen- showed bilateral adrenal hemorrhages Consultants: Dr. Wyn Quaker New medications: tylenol, buspar, bentyl, zofran Discharge instructions:  Follow up here with with vascular surgeon in 1 month. Keep log of BP and sugar Status: better    Date discharged? 07/10/2017   How have you been since you were released from the hospital? better   Do you understand why you were in the hospital? yes   Do you understand the discharge instructions? yes   Where were you discharged to? Home    Items Reviewed:  Medications reviewed: yes  Allergies reviewed: yes  Dietary changes reviewed: yes  Referrals reviewed: yes   Functional Questionnaire:   Activities of Daily Living (ADLs):   She states they are  independent in the following: ambulation, bathing and hygiene, feeding, continence, grooming, toileting and dressing States they require assistance with the following: none   Any transportation issues/concerns?: no   Any patient concerns? no  Transition of Care Hospital Follow up.   Hospital/Facility: [facility] D/C Physician: Altamese Dilling, MD D/C Date: 07/10/17  Records Requested: 07/12/17 Records Received: 07/12/17 Records Reviewed: 07/12/17  Diagnoses on Discharge:   Date of interactive Contact within 48 hours of discharge: 07/12/17 Contact was through: direct  Date of 7 day or 14 day face-to-face visit:  07/12/17  within 7 days  Outpatient Encounter Medications as of 07/12/2017  Medication Sig  . acetaminophen (TYLENOL) 325 MG tablet Take 2 tablets (650 mg total) by mouth every 6 (six) hours as needed for mild pain (or Fever >/= 101).  . busPIRone (BUSPAR) 10 MG tablet Take 1 tablet (10 mg total) by mouth 3 (three) times daily.  Marland Kitchen dicyclomine (BENTYL) 20 MG tablet Take 20 mg by mouth 2 (two) times daily.  . Omega-3 Fatty Acids (FISH OIL PO) Take 1 Dose by mouth as directed.  . ondansetron (ZOFRAN-ODT) 4 MG disintegrating tablet Take 4 mg by mouth every 8 (eight) hours as needed for nausea/vomiting.  . [DISCONTINUED] busPIRone (BUSPAR) 10 MG tablet Take 10 mg by mouth 3 (three) times daily.   No facility-administered encounter medications on file as of 07/12/2017.     Diagnostic Tests Reviewed/Disposition:   CLINICAL DATA:  Mid abdominal pain  EXAM: CT ABDOMEN AND PELVIS WITH CONTRAST  TECHNIQUE: Multidetector CT imaging  of the abdomen and pelvis was performed using the standard protocol following bolus administration of intravenous contrast.  CONTRAST:  75mL [id number]  COMPARISON:  None.  FINDINGS: Lower chest: No acute abnormality.  Hepatobiliary: No focal liver abnormality is seen. Status post cholecystectomy. No biliary dilatation.  Pancreas:  Unremarkable. No pancreatic ductal dilatation or surrounding inflammatory changes.  Spleen: Normal in size without focal abnormality.  Adrenals/Urinary Tract: The right adrenal gland demonstrates hyperplasia. Some mild stranding is noted adjacent to the right adrenal gland. The left adrenal gland demonstrates a focal mass lesion measuring approximately 4.5 cm. Some central areas of decreased attenuation are identified within the mass as well as some inflammatory change surrounding the adrenal gland and extending inferiorly along the left kidney and Gerota's fascia.  The kidneys are within normal limits without obstructive change. The bladder is decompressed.  Stomach/Bowel: The appendix is not visualized and may have been surgically removed. Correlation with patient's history is recommended. No obstructive or inflammatory changes of the bowel are seen.  Vascular/Lymphatic: No significant vascular findings are present. No enlarged abdominal or pelvic lymph nodes.  Reproductive: Uterus and bilateral adnexa are unremarkable. Changes of prior tubal ligation are seen.  Other: No abdominal wall hernia or abnormality. No abdominopelvic ascites.  Musculoskeletal: No acute or significant osseous findings.  IMPRESSION: Changes involving the adrenal glands bilaterally with some periadrenal stranding and significant enlargement of the left adrenal gland. This likely represents acute adrenal hemorrhage. The possibility of underlying mass deserves consideration and follow-up MRI examination in 4-6 weeks may be helpful. Additionally correlation with adrenal laboratory values is recommended to assess for the degree of adrenal function.  No other focal abnormality is seen.  Critical Value/emergent results were called by telephone at the time of interpretation on 07/09/2017 at 2:17 pm to Dr. Lamont Snowball , who verbally acknowledged these results.  Consults: Vascular surgery, Dr.  Gilda Crease  Discharge Instructions: Follow up with [place] within 1 week and follow up with vascular in 1 month.   Disease/illness Education: Discussed at the appointment today  Home Health/Community Services Discussions/Referrals: N/A- has appointment already scheduled with vascular  Establishment or re-establishment of referral orders for community resources: N/A  Discussion with other health care providers: N/A  Assessment and Support of treatment regimen adherence: Good  Appointments Coordinated with: Patient  Education for self-management, independent living, and ADLs: Discussed with patient   ANXIETY/STRESS- has a long history of anxiety which is pretty severe, cannot drive Duration: about a year ago Anxious mood: yes  Excessive worrying: yes Irritability: yes  Sweating: yes Nausea: no Palpitations:yes Hyperventilation: no Panic attacks: yes- with arguing, 1x ever couple of month Agoraphobia: no  Obscessions/compulsions: no Depressed mood: no Depression screen PHQ 2/9 07/12/2017  Decreased Interest 0  Down, Depressed, Hopeless 0  PHQ - 2 Score 0  Altered sleeping 2  Tired, decreased energy 1  Change in appetite 0  Feeling bad or failure about yourself  0  Trouble concentrating 0  Moving slowly or fidgety/restless 0  Suicidal thoughts 0  PHQ-9 Score 3  Difficult doing work/chores Not difficult at all   Anhedonia: no Weight changes: no Insomnia: yes hard to fall asleep  Hypersomnia: no Fatigue/loss of energy: yes Feelings of worthlessness: no Feelings of guilt: no Impaired concentration/indecisiveness: no Suicidal ideations: no  Crying spells: no Recent Stressors/Life Changes: yes   Relationship problems: yes   Family stress: yes     Financial stress: yes    Job stress: yes    Recent death/loss:  no  Active Ambulatory Problems    Diagnosis Date Noted  . Adrenal hemorrhage (HCC) 07/09/2017  . Generalized anxiety disorder with panic attacks 07/12/2017    Resolved Ambulatory Problems    Diagnosis Date Noted  . No Resolved Ambulatory Problems   Past Medical History:  Diagnosis Date  . Adrenal hemorrhage (HCC)   . Anxiety    Past Surgical History:  Procedure Laterality Date  . CESAREAN SECTION    . CHOLECYSTECTOMY    . ELBOW SURGERY Right   . FINGER SURGERY     Age 80   Outpatient Encounter Medications as of 07/12/2017  Medication Sig  . acetaminophen (TYLENOL) 325 MG tablet Take 2 tablets (650 mg total) by mouth every 6 (six) hours as needed for mild pain (or Fever >/= 101).  . busPIRone (BUSPAR) 10 MG tablet Take 1 tablet (10 mg total) by mouth 3 (three) times daily.  Marland Kitchen dicyclomine (BENTYL) 20 MG tablet Take 20 mg by mouth 2 (two) times daily.  . Omega-3 Fatty Acids (FISH OIL PO) Take 1 Dose by mouth as directed.  . ondansetron (ZOFRAN-ODT) 4 MG disintegrating tablet Take 4 mg by mouth every 8 (eight) hours as needed for nausea/vomiting.  . [DISCONTINUED] busPIRone (BUSPAR) 10 MG tablet Take 10 mg by mouth 3 (three) times daily.   No facility-administered encounter medications on file as of 07/12/2017.    Allergies  Allergen Reactions  . Wellbutrin [Bupropion] Rash   Family History  Problem Relation Age of Onset  . Hypertension Other   . Diabetes Mother   . Hypertension Mother   . Stroke Mother   . Diabetes Father   . Hyperlipidemia Father   . Heart attack Maternal Uncle   . Cancer Maternal Grandmother        Lung  . Hypertension Maternal Grandmother   . Diabetes Maternal Grandmother   . Heart disease Maternal Grandfather   . Heart attack Maternal Grandfather   . Cancer Paternal Grandmother        Breast   Social History   Socioeconomic History  . Marital status: Married    Spouse name: Not on file  . Number of children: Not on file  . Years of education: Not on file  . Highest education level: Not on file  Occupational History  . Not on file  Social Needs  . Financial resource strain: Not on file  . Food  insecurity:    Worry: Not on file    Inability: Not on file  . Transportation needs:    Medical: Not on file    Non-medical: Not on file  Tobacco Use  . Smoking status: Current Every Day Smoker    Packs/day: 1.00    Years: 15.00    Pack years: 15.00  . Smokeless tobacco: Never Used  Substance and Sexual Activity  . Alcohol use: Not Currently  . Drug use: Never  . Sexual activity: Not on file  Lifestyle  . Physical activity:    Days per week: Not on file    Minutes per session: Not on file  . Stress: Not on file  Relationships  . Social connections:    Talks on phone: Not on file    Gets together: Not on file    Attends religious service: Not on file    Active member of club or organization: Not on file    Attends meetings of clubs or organizations: Not on file    Relationship status: Not on file  Other  Topics Concern  . Not on file  Social History Narrative  . Not on file    Review of Systems  Constitutional: Positive for fatigue. Negative for activity change, appetite change, chills, diaphoresis, fever and unexpected weight change.  HENT: Negative.   Respiratory: Negative.   Cardiovascular: Negative.   Gastrointestinal: Negative.   Musculoskeletal: Positive for back pain. Negative for arthralgias, gait problem, joint swelling, myalgias, neck pain and neck stiffness.  Skin: Negative.   Neurological: Negative.   Psychiatric/Behavioral: Negative.     Per HPI unless specifically indicated above     Objective:    BP 120/79 (BP Location: Right Arm, Patient Position: Sitting, Cuff Size: Normal)   Pulse 77   Temp 98.3 F (36.8 C) (Oral)   Ht 5\' 1"  (1.549 m)   Wt 208 lb 8 oz (94.6 kg)   LMP 07/09/2017   SpO2 99%   BMI 39.40 kg/m   Wt Readings from Last 3 Encounters:  07/12/17 208 lb 8 oz (94.6 kg)  07/09/17 220 lb (99.8 kg)    Physical Exam  Constitutional: She is oriented to person, place, and time. She appears well-developed and well-nourished. No  distress.  HENT:  Head: Normocephalic and atraumatic.  Right Ear: Hearing normal.  Left Ear: Hearing normal.  Nose: Nose normal.  Eyes: Conjunctivae and lids are normal. Right eye exhibits no discharge. Left eye exhibits no discharge. No scleral icterus.  Cardiovascular: Normal rate, regular rhythm, normal heart sounds and intact distal pulses. Exam reveals no gallop and no friction rub.  No murmur heard. Pulmonary/Chest: Effort normal and breath sounds normal. No stridor. No respiratory distress. She has no wheezes. She has no rales. She exhibits no tenderness.  Abdominal: Soft. Bowel sounds are normal. She exhibits no distension and no mass. There is no tenderness. There is no rebound and no guarding. No hernia.  Musculoskeletal: Normal range of motion. She exhibits no edema, tenderness or deformity.  Neurological: She is alert and oriented to person, place, and time.  Skin: Skin is warm, dry and intact. Capillary refill takes less than 2 seconds. No rash noted. She is not diaphoretic. No erythema. No pallor.  Psychiatric: Her speech is normal and behavior is normal. Judgment and thought content normal. Her mood appears anxious. Cognition and memory are normal.  Nursing note and vitals reviewed.   Results for orders placed or performed during the hospital encounter of 07/09/17  CULTURE, BLOOD (ROUTINE X 2) w Reflex to ID Panel  Result Value Ref Range   Specimen Description BLOOD BLOOD RIGHT HAND    Special Requests      BOTTLES DRAWN AEROBIC AND ANAEROBIC Blood Culture results may not be optimal due to an inadequate volume of blood received in culture bottles   Culture      NO GROWTH 3 DAYS Performed at [facility], [address] [place], [place] [address]    Report Status PENDING   CULTURE, BLOOD (ROUTINE X 2) w Reflex to ID Panel  Result Value Ref Range   Specimen Description BLOOD BLOOD LEFT FOREARM    Special Requests      BOTTLES DRAWN AEROBIC AND ANAEROBIC Blood  Culture results may not be optimal due to an inadequate volume of blood received in culture bottles   Culture      NO GROWTH 3 DAYS Performed at [facility], [address] [place], [place] [address]    Report Status PENDING   Urinalysis, Complete w Microscopic  Result Value Ref  Range   Color, Urine YELLOW (A) YELLOW   APPearance HAZY (A) CLEAR   Specific Gravity, Urine 1.010 1.005 - 1.030   pH 6.0 5.0 - 8.0   Glucose, UA NEGATIVE NEGATIVE mg/dL   Hgb urine dipstick NEGATIVE NEGATIVE   Bilirubin Urine NEGATIVE NEGATIVE   Ketones, ur NEGATIVE NEGATIVE mg/dL   Protein, ur NEGATIVE NEGATIVE mg/dL   Nitrite NEGATIVE NEGATIVE   Leukocytes, UA NEGATIVE NEGATIVE   RBC / HPF 0-5 0 - 5 RBC/hpf   WBC, UA 0-5 0 - 5 WBC/hpf   Bacteria, UA MANY (A) NONE SEEN   Squamous Epithelial / LPF 0-5 0 - 5   Mucus PRESENT   Basic metabolic panel  Result Value Ref Range   Sodium 135 135 - 145 mmol/L   Potassium 3.1 (L) 3.5 - 5.1 mmol/L   Chloride 98 (L) 101 - 111 mmol/L   CO2 27 22 - 32 mmol/L   Glucose, Bld 97 65 - 99 mg/dL   BUN 11 6 - 20 mg/dL   Creatinine, Ser 1.61 0.44 - 1.00 mg/dL   Calcium 9.1 8.9 - 09.6 mg/dL   GFR calc non Af Amer >60 >60 mL/min   GFR calc Af Amer >60 >60 mL/min   Anion gap 10 5 - 15  CBC  Result Value Ref Range   WBC 14.5 (H) 3.6 - 11.0 K/uL   RBC 4.71 3.80 - 5.20 MIL/uL   Hemoglobin 14.1 12.0 - 16.0 g/dL   HCT 04.5 40.9 - 81.1 %   MCV 89.8 80.0 - 100.0 fL   MCH 30.0 26.0 - 34.0 pg   MCHC 33.4 32.0 - 36.0 g/dL   RDW 91.4 (H) 78.2 - 95.6 %   Platelets 269 150 - 440 K/uL  Lipase, blood  Result Value Ref Range   Lipase 23 11 - 51 U/L  Troponin I  Result Value Ref Range   Troponin I <0.03 <0.03 ng/mL  Pregnancy, urine  Result Value Ref Range   Preg Test, Ur NEGATIVE NEGATIVE  HIV antibody (Routine Testing)  Result Value Ref Range   HIV Screen 4th Generation wRfx Non Reactive Non Reactive  Basic metabolic panel  Result Value Ref Range   Sodium  139 135 - 145 mmol/L   Potassium 3.7 3.5 - 5.1 mmol/L   Chloride 107 101 - 111 mmol/L   CO2 26 22 - 32 mmol/L   Glucose, Bld 121 (H) 65 - 99 mg/dL   BUN 11 6 - 20 mg/dL   Creatinine, Ser 2.13 0.44 - 1.00 mg/dL   Calcium 8.3 (L) 8.9 - 10.3 mg/dL   GFR calc non Af Amer >60 >60 mL/min   GFR calc Af Amer >60 >60 mL/min   Anion gap 6 5 - 15  CBC  Result Value Ref Range   WBC 8.5 3.6 - 11.0 K/uL   RBC 4.03 3.80 - 5.20 MIL/uL   Hemoglobin 12.2 12.0 - 16.0 g/dL   HCT 08.6 57.8 - 46.9 %   MCV 90.1 80.0 - 100.0 fL   MCH 30.2 26.0 - 34.0 pg   MCHC 33.5 32.0 - 36.0 g/dL   RDW 62.9 52.8 - 41.3 %   Platelets 242 150 - 440 K/uL  Cortisol  Result Value Ref Range   Cortisol, Plasma 6.8 ug/dL  Hemoglobin  Result Value Ref Range   Hemoglobin 12.4 12.0 - 16.0 g/dL      Assessment & Plan:   Problem List Items Addressed This Visit  Endocrine   Adrenal hemorrhage (HCC) - Primary    Still of unknown cause. Will check labs. See vascular as scheduled. Call with increasing pain. Call with any concerns.         Other   Generalized anxiety disorder with panic attacks    Not under good control. Unclear if she has ever been on preventative medicine. Will obtain records from former PCP and get her back in 2-3 weeks for further discussion. Call with any concerns.       Relevant Medications   busPIRone (BUSPAR) 10 MG tablet    Other Visit Diagnoses    Leukocytosis, unspecified type       Rechecking labs today. Await results.    Relevant Orders   CBC with Differential/Platelet   Hyperglycemia       A1c 5.5- no need to be checking sugars at home.    Relevant Orders   Bayer DCA Hb A1c Waived       Follow up plan: Return 2-3 weeks, for follow up anxiety.

## 2017-07-12 NOTE — Assessment & Plan Note (Signed)
Still of unknown cause. Will check labs. See vascular as scheduled. Call with increasing pain. Call with any concerns.

## 2017-07-13 ENCOUNTER — Telehealth: Payer: Self-pay | Admitting: Family Medicine

## 2017-07-13 LAB — CBC WITH DIFFERENTIAL/PLATELET
Basophils Absolute: 0 10*3/uL (ref 0.0–0.2)
Basos: 0 %
EOS (ABSOLUTE): 0.2 10*3/uL (ref 0.0–0.4)
Eos: 2 %
Hematocrit: 42.7 % (ref 34.0–46.6)
Hemoglobin: 13.3 g/dL (ref 11.1–15.9)
Immature Grans (Abs): 0 10*3/uL (ref 0.0–0.1)
Immature Granulocytes: 0 %
Lymphocytes Absolute: 2.4 10*3/uL (ref 0.7–3.1)
Lymphs: 26 %
MCH: 29 pg (ref 26.6–33.0)
MCHC: 31.1 g/dL — ABNORMAL LOW (ref 31.5–35.7)
MCV: 93 fL (ref 79–97)
Monocytes Absolute: 0.6 10*3/uL (ref 0.1–0.9)
Monocytes: 6 %
Neutrophils Absolute: 6.1 10*3/uL (ref 1.4–7.0)
Neutrophils: 66 %
Platelets: 331 10*3/uL (ref 150–379)
RBC: 4.58 x10E6/uL (ref 3.77–5.28)
RDW: 14.6 % (ref 12.3–15.4)
WBC: 9.3 10*3/uL (ref 3.4–10.8)

## 2017-07-13 NOTE — Telephone Encounter (Signed)
Patient called back and was given the information regarding her WBC and hemoglobin  Just FYI

## 2017-07-13 NOTE — Telephone Encounter (Signed)
Please let her know that her WBC has come back to normal and her hemoglobin is also back to normal. Thanks!

## 2017-07-13 NOTE — Telephone Encounter (Signed)
Left message on machine for pt to return call to the office.  

## 2017-07-14 LAB — CULTURE, BLOOD (ROUTINE X 2)
Culture: NO GROWTH
Culture: NO GROWTH

## 2017-07-20 ENCOUNTER — Encounter: Payer: Self-pay | Admitting: Unknown Physician Specialty

## 2017-07-20 ENCOUNTER — Ambulatory Visit: Payer: Self-pay

## 2017-07-20 ENCOUNTER — Ambulatory Visit (INDEPENDENT_AMBULATORY_CARE_PROVIDER_SITE_OTHER): Payer: Self-pay | Admitting: Unknown Physician Specialty

## 2017-07-20 VITALS — BP 114/82 | HR 89 | Temp 97.8°F | Ht 61.0 in | Wt 207.4 lb

## 2017-07-20 DIAGNOSIS — K219 Gastro-esophageal reflux disease without esophagitis: Secondary | ICD-10-CM

## 2017-07-20 DIAGNOSIS — R1013 Epigastric pain: Secondary | ICD-10-CM

## 2017-07-20 DIAGNOSIS — E279 Disorder of adrenal gland, unspecified: Secondary | ICD-10-CM

## 2017-07-20 LAB — UA/M W/RFLX CULTURE, ROUTINE
Bilirubin, UA: NEGATIVE
Glucose, UA: NEGATIVE
Leukocytes, UA: NEGATIVE
Nitrite, UA: NEGATIVE
Protein, UA: NEGATIVE
Specific Gravity, UA: 1.025 (ref 1.005–1.030)
Urobilinogen, Ur: 0.2 mg/dL (ref 0.2–1.0)
pH, UA: 5 (ref 5.0–7.5)

## 2017-07-20 LAB — MICROSCOPIC EXAMINATION: WBC, UA: NONE SEEN /hpf (ref 0–5)

## 2017-07-20 MED ORDER — OMEPRAZOLE 40 MG PO CPDR: 40.0000 mg | DELAYED_RELEASE_CAPSULE | Freq: Every day | ORAL | 3 refills | Status: DC

## 2017-07-20 NOTE — Telephone Encounter (Signed)
Pt. Reports she was recently in the hospital with adrenal glands "bleeding." States she went back to work this week and did well, but this morning she started having pain "in the middle of my stomach and it goes to the left side a little." No nausea, vomiting, diarrhea or fever. Appointment made for this afternoon. Flow coordinator, Christan, will discuss  With Evelyn Webster.Pt. Verbalizes understanding.  Reason for Disposition . [1] MODERATE pain (e.g., interferes with normal activities) AND [2] pain comes and goes (cramps) AND [3] present > 24 hours  (Exception: pain with Vomiting or Diarrhea - see that Guideline)  Answer Assessment - Initial Assessment Questions 1. LOCATION: "Where does it hurt?"      Middle of stomach 2. RADIATION: "Does the pain shoot anywhere else?" (e.g., chest, back)     To the left a little 3. ONSET: "When did the pain begin?" (e.g., minutes, hours or days ago)      Started this morning 4. SUDDEN: "Gradual or sudden onset?"     Sudden 5. PATTERN "Does the pain come and go, or is it constant?"    - If constant: "Is it getting better, staying the same, or worsening?"      (Note: Constant means the pain never goes away completely; most serious pain is constant and it progresses)     - If intermittent: "How long does it last?" "Do you have pain now?"     (Note: Intermittent means the pain goes away completely between bouts)     Comes and goes 6. SEVERITY: "How bad is the pain?"  (e.g., Scale 1-10; mild, moderate, or severe)   - MILD (1-3): doesn't interfere with normal activities, abdomen soft and not tender to touch    - MODERATE (4-7): interferes with normal activities or awakens from sleep, tender to touch    - SEVERE (8-10): excruciating pain, doubled over, unable to do any normal activities      3 7. RECURRENT SYMPTOM: "Have you ever had this type of abdominal pain before?" If so, ask: "When was the last time?" and "What happened that time?"      Yes 8. CAUSE: "What  do you think is causing the abdominal pain?"     Unsure 9. RELIEVING/AGGRAVATING FACTORS: "What makes it better or worse?" (e.g., movement, antacids, bowel movement)     Rest and Tylenol 10. OTHER SYMPTOMS: "Has there been any vomiting, diarrhea, constipation, or urine problems?"       No 11. PREGNANCY: "Is there any chance you are pregnant?" "When was your last menstrual period?"       No  Protocols used: ABDOMINAL PAIN - [facility]

## 2017-07-20 NOTE — Telephone Encounter (Signed)
I think she's good to see Evelyn Webster and she can see if she needs to see the specialist- her labs have been stable and she had restrictions at work. I'm going to forward this as an Financial planner to [employer]

## 2017-07-20 NOTE — Assessment & Plan Note (Signed)
Was taking Omeprazole 20 mg. Increase to 40 mg.

## 2017-07-20 NOTE — Progress Notes (Signed)
BP 114/82   Pulse 89   Temp 97.8 F (36.6 C) (Oral)   Ht  (1.549 m)   Wt 207 lb 6.4 oz (94.1 kg)   LMP 07/09/2017   SpO2 98%   BMI 39.19 kg/m    Subjective:    Patient ID: Evelyn Webster, female    DOB: 1980/10/16, 37 y.o.   MRN: [id number]  HPI: Evelyn Webster is a 37 y.o. female  Chief Complaint  Patient presents with  . Abdominal Pain    mid stomach was hurting this morning, has stopped now    Pt went to the ER 5/6 and 5/7 for severe abdominal pain.  This improved But had a brief but severe episode this AM  Abdominal Pain  This is a new problem. The current episode started today. The onset quality is sudden (Had a sudden onset this AM of epigastric pain.  Tnis seems to resolve on it's pain.  ). The pain is located in the epigastric region. Pain severity now: currently in no pain. The abdominal pain does not radiate. Pertinent negatives include no constipation, nausea or vomiting. Nothing aggravates the pain. The pain is relieved by nothing. She has tried nothing for the symptoms. Prior workup: Taking Omeprazole for acid reflux.  She had been on 40 mg but now taking 20 mg. Her past medical history is significant for GERD.   She would like a note for work  Relevant past medical, surgical, family and social history reviewed and updated as indicated. Interim medical history since our last visit reviewed. Allergies and medications reviewed and updated.  Review of Systems  Constitutional: Positive for fatigue.  Respiratory: Negative.   Cardiovascular: Negative.   Gastrointestinal: Positive for abdominal pain. Negative for constipation, nausea and vomiting.  Genitourinary:       Bleeding off and on  Neurological: Positive for dizziness.  Psychiatric/Behavioral: Positive for agitation and decreased concentration. The patient is nervous/anxious.     Per HPI unless specifically indicated above     Objective:    BP 114/82   Pulse 89   Temp 97.8 F (36.6 C) (Oral)    Ht  (1.549 m)   Wt 207 lb 6.4 oz (94.1 kg)   LMP 07/09/2017   SpO2 98%   BMI 39.19 kg/m   Wt Readings from Last 3 Encounters:  07/20/17 207 lb 6.4 oz (94.1 kg)  07/12/17 208 lb 8 oz (94.6 kg)  07/09/17 220 lb (99.8 kg)    Physical Exam  Constitutional: She is oriented to person, place, and time. She appears well-developed and well-nourished. No distress.  HENT:  Head: Normocephalic and atraumatic.  Eyes: Conjunctivae and lids are normal. Right eye exhibits no discharge. Left eye exhibits no discharge. No scleral icterus.  Cardiovascular: Normal rate.  Pulmonary/Chest: Effort normal.  Abdominal: Normal appearance. There is no splenomegaly or hepatomegaly.  Musculoskeletal: Normal range of motion.  Neurological: She is alert and oriented to person, place, and time.  Skin: Skin is intact. No rash noted. No pallor.  Psychiatric: She has a normal mood and affect. Her behavior is normal. Judgment and thought content normal.    Results for orders placed or performed in visit on 07/12/17  CBC with Differential/Platelet  Result Value Ref Range   WBC 9.3 3.4 - 10.8 x10E3/uL   RBC 4.58 3.77 - 5.28 x10E6/uL   Hemoglobin 13.3 11.1 - 15.9 g/dL   Hematocrit 40.9 81.1 - 46.6 %   MCV 93 79 - 97 fL  MCH 29.0 26.6 - 33.0 pg   MCHC 31.1 (L) 31.5 - 35.7 g/dL   RDW 40.9 81.1 - 91.4 %   Platelets 331 150 - 379 x10E3/uL   Neutrophils 66 Not Estab. %   Lymphs 26 Not Estab. %   Monocytes 6 Not Estab. %   Eos 2 Not Estab. %   Basos 0 Not Estab. %   Neutrophils Absolute 6.1 1.4 - 7.0 x10E3/uL   Lymphocytes Absolute 2.4 0.7 - 3.1 x10E3/uL   Monocytes Absolute 0.6 0.1 - 0.9 x10E3/uL   EOS (ABSOLUTE) 0.2 0.0 - 0.4 x10E3/uL   Basophils Absolute 0.0 0.0 - 0.2 x10E3/uL   Immature Granulocytes 0 Not Estab. %   Immature Grans (Abs) 0.0 0.0 - 0.1 x10E3/uL  Bayer DCA Hb A1c Waived  Result Value Ref Range   Bayer DCA Hb A1c Waived 5.5 <7.0 %      Assessment & Plan:   Problem List Items  Addressed This Visit      Unprioritized   Chronic GERD    Was taking Omeprazole 20 mg. Increase to 40 mg.        Relevant Medications   omeprazole (PRILOSEC) 40 MG capsule    Other Visit Diagnoses    Epigastric pain    -  Primary   Relevant Orders   UA/M w/rflx Culture, Routine   Adrenal abnormality (HCC)       Check Cortisol level in the AM on Monday.  Consider Endocrine referral   Relevant Orders   Cortisol      Send message to office manager to see if charity care is a possibility  Follow up plan: Return if symptoms worsen or fail to improve.

## 2017-07-23 ENCOUNTER — Other Ambulatory Visit: Payer: Medicaid Other

## 2017-07-23 DIAGNOSIS — E279 Disorder of adrenal gland, unspecified: Secondary | ICD-10-CM

## 2017-07-24 LAB — CORTISOL: Cortisol: 13.8 ug/dL

## 2017-07-26 ENCOUNTER — Telehealth: Payer: Self-pay | Admitting: Family Medicine

## 2017-07-26 NOTE — Telephone Encounter (Signed)
Pt given results per Olevia Perches, "Please let her know that her WBC has come back to normal and her hemoglobin is also back to normal. Thanks!' pt verbalizes understanding; unable to chart in result note because no encounter created.

## 2017-07-26 NOTE — Telephone Encounter (Deleted)
Copied from CRM [id number]. Topic: General - Other >> Jul 26, 2017 10:52 AM Gerrianne Scale wrote: Reason for CRM: pt calling back about lab results

## 2017-07-30 NOTE — Progress Notes (Deleted)
   LMP 07/09/2017    Subjective:    Patient ID: Evelyn Webster, female    DOB: May 16, 1980, 37 y.o.   MRN: [id number]  HPI: Evelyn Webster is a 37 y.o. female  No chief complaint on file.  ANXIETY/STRESS Duration:{Blank single:19197::"controlled","uncontrolled","better","worse","exacerbated","stable"} Anxious mood: {Blank single:19197::"yes","no"}  Excessive worrying: {Blank single:19197::"yes","no"} Irritability: {Blank single:19197::"yes","no"}  Sweating: {Blank single:19197::"yes","no"} Nausea: {Blank single:19197::"yes","no"} Palpitations:{Blank single:19197::"yes","no"} Hyperventilation: {Blank single:19197::"yes","no"} Panic attacks: {Blank single:19197::"yes","no"} Agoraphobia: {Blank single:19197::"yes","no"}  Obscessions/compulsions: {Blank single:19197::"yes","no"} Depressed mood: {Blank single:19197::"yes","no"} Depression screen PHQ 2/9 07/12/2017  Decreased Interest 0  Down, Depressed, Hopeless 0  PHQ - 2 Score 0  Altered sleeping 2  Tired, decreased energy 1  Change in appetite 0  Feeling bad or failure about yourself  0  Trouble concentrating 0  Moving slowly or fidgety/restless 0  Suicidal thoughts 0  PHQ-9 Score 3  Difficult doing work/chores Not difficult at all   Anhedonia: {Blank single:19197::"yes","no"} Weight changes: {Blank single:19197::"yes","no"} Insomnia: {Blank single:19197::"yes","no"} {Blank single:19197::"hard to fall asleep","hard to stay asleep"}  Hypersomnia: {Blank single:19197::"yes","no"} Fatigue/loss of energy: {Blank single:19197::"yes","no"} Feelings of worthlessness: {Blank single:19197::"yes","no"} Feelings of guilt: {Blank single:19197::"yes","no"} Impaired concentration/indecisiveness: {Blank single:19197::"yes","no"} Suicidal ideations: {Blank single:19197::"yes","no"}  Crying spells: {Blank single:19197::"yes","no"} Recent Stressors/Life Changes: {Blank single:19197::"yes","no"}   Relationship problems: {Blank  single:19197::"yes","no"}   Family stress: {Blank single:19197::"yes","no"}     Financial stress: {Blank single:19197::"yes","no"}    Job stress: {Blank single:19197::"yes","no"}    Recent death/loss: {Blank single:19197::"yes","no"}   Relevant past medical, surgical, family and social history reviewed and updated as indicated. Interim medical history since our last visit reviewed. Allergies and medications reviewed and updated.  Review of Systems  Per HPI unless specifically indicated above     Objective:    LMP 07/09/2017   Wt Readings from Last 3 Encounters:  07/20/17 207 lb 6.4 oz (94.1 kg)  07/12/17 208 lb 8 oz (94.6 kg)  07/09/17 220 lb (99.8 kg)    Physical Exam  Results for orders placed or performed in visit on 07/23/17  Cortisol  Result Value Ref Range   Cortisol 13.8 ug/dL      Assessment & Plan:   Problem List Items Addressed This Visit      Other   Generalized anxiety disorder with panic attacks - Primary       Follow up plan: No follow-ups on file.

## 2017-07-31 ENCOUNTER — Ambulatory Visit: Payer: Self-pay | Admitting: Family Medicine

## 2017-08-06 ENCOUNTER — Telehealth (INDEPENDENT_AMBULATORY_CARE_PROVIDER_SITE_OTHER): Payer: Self-pay | Admitting: Vascular Surgery

## 2017-08-06 ENCOUNTER — Other Ambulatory Visit (INDEPENDENT_AMBULATORY_CARE_PROVIDER_SITE_OTHER): Payer: Self-pay | Admitting: Vascular Surgery

## 2017-08-06 DIAGNOSIS — E2749 Other adrenocortical insufficiency: Secondary | ICD-10-CM

## 2017-08-06 NOTE — Telephone Encounter (Signed)
PT STATES SHE WAS ADVISED BY JD IN THE HOSPITAL THAT SHE NEEDED A CT DONE PRIOR TO SEEING HIM. ORDER HAS NOT BEEN PLACED.

## 2017-08-06 NOTE — Telephone Encounter (Signed)
Order is placed.

## 2017-08-07 ENCOUNTER — Ambulatory Visit (INDEPENDENT_AMBULATORY_CARE_PROVIDER_SITE_OTHER): Payer: Self-pay | Admitting: Family Medicine

## 2017-08-07 ENCOUNTER — Encounter: Payer: Self-pay | Admitting: Family Medicine

## 2017-08-07 VITALS — BP 119/82 | HR 84 | Temp 97.7°F | Wt 208.3 lb

## 2017-08-07 DIAGNOSIS — F411 Generalized anxiety disorder: Secondary | ICD-10-CM

## 2017-08-07 DIAGNOSIS — F410 Panic disorder [episodic paroxysmal anxiety] without agoraphobia: Secondary | ICD-10-CM

## 2017-08-07 DIAGNOSIS — L739 Follicular disorder, unspecified: Secondary | ICD-10-CM

## 2017-08-07 MED ORDER — SERTRALINE HCL 50 MG PO TABS: ORAL_TABLET | ORAL | 3 refills | Status: DC

## 2017-08-07 MED ORDER — HYDROXYZINE HCL 25 MG PO TABS
25.0000 mg | ORAL_TABLET | Freq: Three times a day (TID) | ORAL | 3 refills | Status: DC | PRN
Start: 2017-08-07 — End: 2017-11-19

## 2017-08-07 MED ORDER — MUPIROCIN 2 % EX OINT: 1.0000 "application " | TOPICAL_OINTMENT | Freq: Two times a day (BID) | CUTANEOUS | 0 refills | Status: DC

## 2017-08-07 NOTE — Patient Instructions (Signed)
Living With Anxiety After being diagnosed with an anxiety disorder, you may be relieved to know why you have felt or behaved a certain way. It is natural to also feel overwhelmed about the treatment ahead and what it will mean for your life. With care and support, you can manage this condition and recover from it. How to cope with anxiety Dealing with stress Stress is your body's reaction to life changes and events, both good and bad. Stress can last just a few hours or it can be ongoing. Stress can play a major role in anxiety, so it is important to learn both how to cope with stress and how to think about it differently. Talk with your health care provider or a counselor to learn more about stress reduction. He or she may suggest some stress reduction techniques, such as:  Music therapy. This can include creating or listening to music that you enjoy and that inspires you.  Mindfulness-based meditation. This involves being aware of your normal breaths, rather than trying to control your breathing. It can be done while sitting or walking.  Centering prayer. This is a kind of meditation that involves focusing on a word, phrase, or sacred image that is meaningful to you and that brings you peace.  Deep breathing. To do this, expand your stomach and inhale slowly through your nose. Hold your breath for 3-5 seconds. Then exhale slowly, allowing your stomach muscles to relax.  Self-talk. This is a skill where you identify thought patterns that lead to anxiety reactions and correct those thoughts.  Muscle relaxation. This involves tensing muscles then relaxing them.  Choose a stress reduction technique that fits your lifestyle and personality. Stress reduction techniques take time and practice. Set aside 5-15 minutes a day to do them. Therapists can offer training in these techniques. The training may be covered by some insurance plans. Other things you can do to manage stress include:  Keeping a  stress diary. This can help you learn what triggers your stress and ways to control your response.  Thinking about how you respond to certain situations. You may not be able to control everything, but you can control your reaction.  Making time for activities that help you relax, and not feeling guilty about spending your time in this way.  Therapy combined with coping and stress-reduction skills provides the best chance for successful treatment. Medicines Medicines can help ease symptoms. Medicines for anxiety include:  Anti-anxiety drugs.  Antidepressants.  Beta-blockers.  Medicines may be used as the main treatment for anxiety disorder, along with therapy, or if other treatments are not working. Medicines should be prescribed by a health care provider. Relationships Relationships can play a big part in helping you recover. Try to spend more time connecting with trusted friends and family members. Consider going to couples counseling, taking family education classes, or going to family therapy. Therapy can help you and others better understand the condition. How to recognize changes in your condition Everyone has a different response to treatment for anxiety. Recovery from anxiety happens when symptoms decrease and stop interfering with your daily activities at home or work. This may mean that you will start to:  Have better concentration and focus.  Sleep better.  Be less irritable.  Have more energy.  Have improved memory.  It is important to recognize when your condition is getting worse. Contact your health care provider if your symptoms interfere with home or work and you do not feel like your condition   is improving. Where to find help and support: You can get help and support from these sources:  Self-help groups.  Online and [employer] organizations.  A trusted spiritual leader.  Couples counseling.  Family education classes.  Family therapy.  Follow these  instructions at home:  Eat a healthy diet that includes plenty of vegetables, fruits, whole grains, low-fat dairy products, and lean protein. Do not eat a lot of foods that are high in solid fats, added sugars, or salt.  Exercise. Most adults should do the following: ? Exercise for at least 150 minutes each week. The exercise should increase your heart rate and make you sweat (moderate-intensity exercise). ? Strengthening exercises at least twice a week.  Cut down on caffeine, tobacco, alcohol, and other potentially harmful substances.  Get the right amount and quality of sleep. Most adults need 7-9 hours of sleep each night.  Make choices that simplify your life.  Take over-the-counter and prescription medicines only as told by your health care provider.  Avoid caffeine, alcohol, and certain over-the-counter cold medicines. These may make you feel worse. Ask your pharmacist which medicines to avoid.  Keep all follow-up visits as told by your health care provider. This is important. Questions to ask your health care provider  Would I benefit from therapy?  How often should I follow up with a health care provider?  How long do I need to take medicine?  Are there any long-term side effects of my medicine?  Are there any alternatives to taking medicine? Contact a health care provider if:  You have a hard time staying focused or finishing daily tasks.  You spend many hours a day feeling worried about everyday life.  You become exhausted by worry.  You start to have headaches, feel tense, or have nausea.  You urinate more than normal.  You have diarrhea. Get help right away if:  You have a racing heart and shortness of breath.  You have thoughts of hurting yourself or others. If you ever feel like you may hurt yourself or others, or have thoughts about taking your own life, get help right away. You can go to your nearest emergency department or call:  Your local emergency  services (911 in the [place]  A suicide crisis helpline, such as the National Suicide Prevention Lifeline at 616 201 85401-(579)854-6363. This is open 24-hours a day.  Summary  Taking steps to deal with stress can help calm you.  Medicines cannot cure anxiety disorders, but they can help ease symptoms.  Family, friends, and partners can play a big part in helping you recover from an anxiety disorder. This information is not intended to replace advice given to you by your health care provider. Make sure you discuss any questions you have with your health care provider. Document Released: 02/15/2016 Document Revised: 02/15/2016 Document Reviewed: 02/15/2016 Elsevier Interactive Patient Education  2018 [employer]  Folliculitis Folliculitis is inflammation of the hair follicles. Folliculitis most commonly occurs on the scalp, thighs, legs, back, and buttocks. However, it can occur anywhere on the body. What are the causes? This condition may be caused by:  A bacterial infection (common).  A fungal infection.  A viral infection.  Coming into contact with certain chemicals, especially oils and tars.  Shaving or waxing.  Applying greasy ointments or creams to your skin often.  Long-lasting folliculitis and folliculitis that keeps coming back can be caused by bacteria that live in the nostrils. What increases the risk? This condition is more likely to  develop in people with:  A weakened immune system.  Diabetes.  Obesity.  What are the signs or symptoms? Symptoms of this condition include:  Redness.  Soreness.  Swelling.  Itching.  Small white or yellow, pus-filled, itchy spots (pustules) that appear over a reddened area. If there is an infection that goes deep into the follicle, these may develop into a boil (furuncle).  A group of closely packed boils (carbuncle). These tend to form in hairy, sweaty areas of the body.  How is this diagnosed? This condition is diagnosed with  a skin exam. To find what is causing the condition, your health care provider may take a sample of one of the pustules or boils for testing. How is this treated? This condition may be treated by:  Applying warm compresses to the affected areas.  Taking an antibiotic medicine or applying an antibiotic medicine to the skin.  Applying or bathing with an antiseptic solution.  Taking an over-the-counter medicine to help with itching.  Having a procedure to drain any pustules or boils. This may be done if a pustule or boil contains a lot of pus or fluid.  Laser hair removal. This may be done to treat long-lasting folliculitis.  Follow these instructions at home:  If directed, apply heat to the affected area as often as told by your health care provider. Use the heat source that your health care provider recommends, such as a moist heat pack or a heating pad. ? Place a towel between your skin and the heat source. ? Leave the heat on for 20-30 minutes. ? Remove the heat if your skin turns bright red. This is especially important if you are unable to feel pain, heat, or cold. You may have a greater risk of getting burned.  If you were prescribed an antibiotic medicine, use it as told by your health care provider. Do not stop using the antibiotic even if you start to feel better.  Take over-the-counter and prescription medicines only as told by your health care provider.  Do not shave irritated skin.  Keep all follow-up visits as told by your health care provider. This is important. Get help right away if:  You have more redness, swelling, or pain in the affected area.  Red streaks are spreading from the affected area.  You have a fever. This information is not intended to replace advice given to you by your health care provider. Make sure you discuss any questions you have with your health care provider. Document Released: 05/01/2001 Document Revised: 09/10/2015 Document Reviewed:  12/11/2014 Elsevier Interactive Patient Education  2018 [employer].

## 2017-08-07 NOTE — Assessment & Plan Note (Signed)
Did not do well with the buspar. Will start sertraline and hydroxyzine and follow up in 4-6 weeks. Call with any concerns.

## 2017-08-07 NOTE — Progress Notes (Signed)
BP 119/82 (BP Location: Left Arm, Patient Position: Sitting, Cuff Size: Large)   Pulse 84   Temp 97.7 F (36.5 C)   Wt 208 lb 5 oz (94.5 kg)   LMP 07/09/2017   SpO2 100%   BMI 39.36 kg/m    Subjective:    Patient ID: Evelyn Webster, female    DOB: 01/03/1981, 37 y.o.   MRN: [id number]  HPI: Evelyn Webster is a 37 y.o. female  Chief Complaint  Patient presents with  . Anxiety   ANXIETY/STRESS Duration:stable Anxious mood: yes  Excessive worrying: yes Irritability: yes  Sweating: no Nausea: yes Palpitations:yes Hyperventilation: no Panic attacks: yes Agoraphobia: yes  Obscessions/compulsions: no Depressed mood: no Depression screen [facility] 2/9 08/07/2017 07/12/2017  Decreased Interest 0 0  Down, Depressed, Hopeless 0 0  PHQ - 2 Score 0 0  Altered sleeping 1 2  Tired, decreased energy 2 1  Change in appetite 0 0  Feeling bad or failure about yourself  0 0  Trouble concentrating 0 0  Moving slowly or fidgety/restless 0 0  Suicidal thoughts 0 0  PHQ-9 Score 3 3  Difficult doing work/chores Not difficult at all Not difficult at all   GAD 7 : Generalized Anxiety Score 08/07/2017 07/12/2017  Nervous, Anxious, on Edge 3 2  Control/stop worrying 2 2  Worry too much - different things 1 0  Trouble relaxing 3 2  Restless 0 1  Easily annoyed or irritable 1 2  Afraid - awful might happen 0 0  Total GAD 7 Score 10 9  Anxiety Difficulty Somewhat difficult Somewhat difficult   Anhedonia: no Weight changes: no Insomnia: yes hard to fall asleep  Hypersomnia: no Fatigue/loss of energy: yes Feelings of worthlessness: no Feelings of guilt: no Impaired concentration/indecisiveness: no Suicidal ideations: no  Crying spells: no Recent Stressors/Life Changes: no   Relationship problems: no   Family stress: no     Financial stress: no    Job stress: no    Recent death/loss: no  LUMP Duration: months for both Location: under her L arm and on her buttocks Onset:  gradual Painful: yes Discomfort: yes Status:  bigger Trauma: no Redness: unknown Bruising: no Recent infection: no Swollen lymph nodes: no Requesting removal: no History of cancer: no Family history of cancer: no History of the same: no    Relevant past medical, surgical, family and social history reviewed and updated as indicated. Interim medical history since our last visit reviewed. Allergies and medications reviewed and updated.  Review of Systems  Constitutional: Negative.   Respiratory: Negative.   Cardiovascular: Negative.   Gastrointestinal: Negative.   Skin: Positive for color change. Negative for pallor, rash and wound.       Lumps under L arm and on L buttock   Psychiatric/Behavioral: Positive for agitation, dysphoric mood and sleep disturbance. Negative for behavioral problems, confusion, decreased concentration, hallucinations, self-injury and suicidal ideas. The patient is nervous/anxious. The patient is not hyperactive.     Per HPI unless specifically indicated above     Objective:    BP 119/82 (BP Location: Left Arm, Patient Position: Sitting, Cuff Size: Large)   Pulse 84   Temp 97.7 F (36.5 C)   Wt 208 lb 5 oz (94.5 kg)   LMP 07/09/2017   SpO2 100%   BMI 39.36 kg/m   Wt Readings from Last 3 Encounters:  08/07/17 208 lb 5 oz (94.5 kg)  07/20/17 207 lb 6.4 oz (94.1 kg)  07/12/17 208 lb 8  oz (94.6 kg)    Physical Exam  Constitutional: She is oriented to person, place, and time. She appears well-developed and well-nourished. No distress.  HENT:  Head: Normocephalic and atraumatic.  Right Ear: Hearing normal.  Left Ear: Hearing normal.  Nose: Nose normal.  Eyes: Conjunctivae and lids are normal. Right eye exhibits no discharge. Left eye exhibits no discharge. No scleral icterus.  Cardiovascular: Normal rate, regular rhythm, normal heart sounds and intact distal pulses. Exam reveals no gallop and no friction rub.  No murmur  heard. Pulmonary/Chest: Effort normal and breath sounds normal. No stridor. No respiratory distress. She has no wheezes. She has no rales. She exhibits no tenderness.  Musculoskeletal: Normal range of motion.  Neurological: She is alert and oriented to person, place, and time.  Skin: Skin is warm and intact. Capillary refill takes less than 2 seconds. No rash noted. She is not diaphoretic. No erythema. No pallor.  1mm pustule on L buttock, no surrounding erythema, mild tenderness to palpation.   3mm nodule in L axilla with no tenderness or erythema  Psychiatric: She has a normal mood and affect. Her speech is normal and behavior is normal. Judgment and thought content normal. Cognition and memory are normal.    Results for orders placed or performed in visit on 07/23/17  Cortisol  Result Value Ref Range   Cortisol 13.8 ug/dL      Assessment & Plan:   Problem List Items Addressed This Visit      Other   Generalized anxiety disorder with panic attacks - Primary    Did not do well with the buspar. Will start sertraline and hydroxyzine and follow up in 4-6 weeks. Call with any concerns.       Relevant Medications   hydrOXYzine (ATARAX/VISTARIL) 25 MG tablet   sertraline (ZOLOFT) 50 MG tablet    Other Visit Diagnoses    Folliculitis       Warm compresses and bactroban. Call if not getting better or getting worse.        Follow up plan: Return 4-6 weeks follow up anxiety.

## 2017-08-07 NOTE — Telephone Encounter (Signed)
Called the patient back to let her know that the order was placed for her CT and I also gave her the number to go ahead and get the CT scheduled and instructions for her to give us a call back to get on the schedule to be seen by Dr. Wyn Quakerew to get the results from the CT scan.

## 2017-08-09 ENCOUNTER — Telehealth (INDEPENDENT_AMBULATORY_CARE_PROVIDER_SITE_OTHER): Payer: Self-pay

## 2017-08-09 NOTE — Telephone Encounter (Signed)
Lillia AbedLindsay called stating that the patient should be changed from a CT to a MRI. She would like for us to call [phone] to have the test changed to a MRI with the previous reasons attached to this visit as well.  I tried calling the number but the answering service picked up, I didn't leave a message.

## 2017-08-09 NOTE — Telephone Encounter (Signed)
Can we try again in the morning? Not sure as to what they want us to do - I believe once we get in contact with them they will let us know.

## 2017-08-10 ENCOUNTER — Other Ambulatory Visit (INDEPENDENT_AMBULATORY_CARE_PROVIDER_SITE_OTHER): Payer: Self-pay | Admitting: Vascular Surgery

## 2017-08-10 ENCOUNTER — Other Ambulatory Visit (INDEPENDENT_AMBULATORY_CARE_PROVIDER_SITE_OTHER): Payer: Self-pay

## 2017-08-10 DIAGNOSIS — R1900 Intra-abdominal and pelvic swelling, mass and lump, unspecified site: Secondary | ICD-10-CM

## 2017-08-10 NOTE — Telephone Encounter (Signed)
[facility] and [facility] is looking into helping me to get the order changed to an MRI. He is going to call around and get the correct way that the order needs to be written or put in as a order and the CPT codes, and then he's going to have Centralized Scheduling call me back to get her scheduled for the MRI and not a CT scan.

## 2017-08-10 NOTE — Telephone Encounter (Signed)
[facility] put [facility] to complete it.

## 2017-08-15 ENCOUNTER — Telehealth: Payer: Self-pay | Admitting: Family Medicine

## 2017-08-15 NOTE — Telephone Encounter (Signed)
Does she have an MRI scheduled or is she asking for me to order an MRI? Whoever is ordering the MRI should be able to give her something for anxiety, but if they cannot or will not, let me know.

## 2017-08-15 NOTE — Telephone Encounter (Signed)
Tried calling patient, mother answered. No DPR. Explained to mother for patient to give us a call.  Looks like [facility] is ordering a MRI.  It's not [place]/d'd through [facility] system yet or she's getting it done at an outside facility.

## 2017-08-15 NOTE — Telephone Encounter (Signed)
Copied from CRM [id number]. Topic: Referral - Request >> Aug 15, 2017  2:18 PM Stephannie LiSimmons, Nimah Uphoff L, [place] wrote: Reason for CRM: Patient would like a MRI done on 09/03/17 she was to have it done at hospital please advise [phone] and she would like to know is there anything she can be given for anxiety at that time ,

## 2017-08-16 ENCOUNTER — Ambulatory Visit: Admission: RE | Admit: 2017-08-16 | Payer: Self-pay | Source: Ambulatory Visit

## 2017-08-17 ENCOUNTER — Encounter (INDEPENDENT_AMBULATORY_CARE_PROVIDER_SITE_OTHER): Payer: Self-pay | Admitting: Vascular Surgery

## 2017-08-17 NOTE — Telephone Encounter (Signed)
Closing until patient calls back 

## 2017-08-22 ENCOUNTER — Telehealth (INDEPENDENT_AMBULATORY_CARE_PROVIDER_SITE_OTHER): Payer: Self-pay | Admitting: Vascular Surgery

## 2017-08-22 ENCOUNTER — Telehealth: Payer: Self-pay | Admitting: Family Medicine

## 2017-08-22 NOTE — Telephone Encounter (Signed)
CALLING BECAUSE SHE HAS AN UPCOMING MRI AND NEEDS MEDS FOR THE ANXIETY OF HAVING IT DONE. PLEASE CALL AND ADVISE

## 2017-08-22 NOTE — Telephone Encounter (Signed)
Copied from CRM [id number]. Topic: Inquiry >> Aug 22, 2017 10:45 AM Stephannie LiSimmons, Jemeka Wagler L, NT wrote: Reason for CRM: Patient is having an MRI done on 09/01/17 and would like know if she can have something for anxiety please call  [phone]

## 2017-08-22 NOTE — Telephone Encounter (Signed)
Patient notified

## 2017-08-22 NOTE — Telephone Encounter (Signed)
I did not order the MRI. I'd prefer her to get it from the person who's ordering it, but if they will not do it, I'll consider it.

## 2017-08-23 NOTE — Telephone Encounter (Signed)
I would speak to the physician who is ordering the MRI and ask what he would like to prescribe (I believe it is Dew). I am not sure if the medication would be being by the radiology department / the patient is given a prescription since we do not generally order MRI's.

## 2017-08-27 NOTE — Telephone Encounter (Signed)
Called the patient to let her know that Dr. Wyn Quakerew said that we could call in Xanax for her upcoming procedure as long as she would have someone to drive her to and from the MRI appointment. I had to leave this message on her machine. I instructed the patient to call me back to let me know if she could get a driver for that day. Patient called back as I was typing, and I informed her of this information, and told her that I would get the prescription called in within the next hour or so.

## 2017-09-01 ENCOUNTER — Ambulatory Visit
Admission: RE | Admit: 2017-09-01 | Discharge: 2017-09-01 | Disposition: A | Discharge status: Home / Self Care | Payer: Medicaid Other | Source: Ambulatory Visit | Attending: Vascular Surgery | Admitting: Vascular Surgery

## 2017-09-01 DIAGNOSIS — S37812A Contusion of adrenal gland, initial encounter: Secondary | ICD-10-CM | POA: Insufficient documentation

## 2017-09-01 DIAGNOSIS — M5137 Other intervertebral disc degeneration, lumbosacral region: Secondary | ICD-10-CM | POA: Insufficient documentation

## 2017-09-01 DIAGNOSIS — R1900 Intra-abdominal and pelvic swelling, mass and lump, unspecified site: Secondary | ICD-10-CM

## 2017-09-01 MED ORDER — GADOBENATE DIMEGLUMINE 529 MG/ML IV SOLN
19.0000 mL | Freq: Once | INTRAVENOUS | Status: AC | PRN
  Administered 2017-09-01: 19 mL via INTRAVENOUS

## 2017-09-04 ENCOUNTER — Encounter (INDEPENDENT_AMBULATORY_CARE_PROVIDER_SITE_OTHER): Payer: Self-pay | Admitting: Vascular Surgery

## 2017-09-10 ENCOUNTER — Ambulatory Visit: Payer: Medicaid Other | Admitting: Family Medicine

## 2017-09-10 ENCOUNTER — Other Ambulatory Visit: Payer: Self-pay

## 2017-09-10 NOTE — Progress Notes (Deleted)
   There were no vitals taken for this visit.   Subjective:    Patient ID: Evelyn Webster, female    DOB: 06/09/1980, 37 y.o.   MRN: [id number]  HPI: Evelyn Webster is a 37 y.o. female  No chief complaint on file.  ANXIETY/STRESS Duration:{Blank single:19197::"controlled","uncontrolled","better","worse","exacerbated","stable"} Anxious mood: {Blank single:19197::"yes","no"}  Excessive worrying: {Blank single:19197::"yes","no"} Irritability: {Blank single:19197::"yes","no"}  Sweating: {Blank single:19197::"yes","no"} Nausea: {Blank single:19197::"yes","no"} Palpitations:{Blank single:19197::"yes","no"} Hyperventilation: {Blank single:19197::"yes","no"} Panic attacks: {Blank single:19197::"yes","no"} Agoraphobia: {Blank single:19197::"yes","no"}  Obscessions/compulsions: {Blank single:19197::"yes","no"} Depressed mood: {Blank single:19197::"yes","no"} [facility] 08/07/2017 07/12/2017  Decreased Interest 0 0  Down, Depressed, Hopeless 0 0  PHQ - 2 Score 0 0  Altered sleeping 1 2  Tired, decreased energy 2 1  Change in appetite 0 0  Feeling bad or failure about yourself  0 0  Trouble concentrating 0 0  Moving slowly or fidgety/restless 0 0  Suicidal thoughts 0 0  PHQ-9 Score 3 3  Difficult doing work/chores Not difficult at all Not difficult at all   Anhedonia: {Blank single:19197::"yes","no"} Weight changes: {Blank single:19197::"yes","no"} Insomnia: {Blank single:19197::"yes","no"} {Blank single:19197::"hard to fall asleep","hard to stay asleep"}  Hypersomnia: {Blank single:19197::"yes","no"} Fatigue/loss of energy: {Blank single:19197::"yes","no"} Feelings of worthlessness: {Blank single:19197::"yes","no"} Feelings of guilt: {Blank single:19197::"yes","no"} Impaired concentration/indecisiveness: {Blank single:19197::"yes","no"} Suicidal ideations: {Blank single:19197::"yes","no"}  Crying spells: {Blank single:19197::"yes","no"} Recent Stressors/Life Changes:  {Blank single:19197::"yes","no"}   Relationship problems: {Blank single:19197::"yes","no"}   Family stress: {Blank single:19197::"yes","no"}     Financial stress: {Blank single:19197::"yes","no"}    Job stress: {Blank single:19197::"yes","no"}    Recent death/loss: {Blank single:19197::"yes","no"}   Relevant past medical, surgical, family and social history reviewed and updated as indicated. Interim medical history since our last visit reviewed. Allergies and medications reviewed and updated.  Review of Systems  Per HPI unless specifically indicated above     Objective:    There were no vitals taken for this visit.  Wt Readings from Last 3 Encounters:  08/07/17 208 lb 5 oz (94.5 kg)  07/20/17 207 lb 6.4 oz (94.1 kg)  07/12/17 208 lb 8 oz (94.6 kg)    Physical Exam  Results for orders placed or performed in visit on 07/23/17  Cortisol  Result Value Ref Range   Cortisol 13.8 ug/dL      Assessment & Plan:   Problem List Items Addressed This Visit      Other   Generalized anxiety disorder with panic attacks - Primary       Follow up plan: No follow-ups on file.

## 2017-09-14 ENCOUNTER — Ambulatory Visit (INDEPENDENT_AMBULATORY_CARE_PROVIDER_SITE_OTHER): Payer: Medicaid Other | Admitting: Vascular Surgery

## 2017-09-25 ENCOUNTER — Ambulatory Visit (INDEPENDENT_AMBULATORY_CARE_PROVIDER_SITE_OTHER): Payer: Medicaid Other | Admitting: Vascular Surgery

## 2017-10-09 ENCOUNTER — Ambulatory Visit (INDEPENDENT_AMBULATORY_CARE_PROVIDER_SITE_OTHER): Payer: Self-pay | Admitting: Vascular Surgery

## 2017-10-09 ENCOUNTER — Encounter (INDEPENDENT_AMBULATORY_CARE_PROVIDER_SITE_OTHER): Payer: Self-pay | Admitting: Vascular Surgery

## 2017-10-09 VITALS — BP 121/86 | HR 78 | Resp 18 | Ht 61.0 in | Wt 210.4 lb

## 2017-10-09 DIAGNOSIS — E2749 Other adrenocortical insufficiency: Secondary | ICD-10-CM

## 2017-10-09 NOTE — Assessment & Plan Note (Signed)
She had an MRI to follow-up this adrenal hemorrhage earlier this week which I have independently reviewed.  The left adrenal hemorrhage is markedly reduced in size.  Previously it measured about 40 cm in volume and now it measures only about 5 to 6 cm in volume.  There does not appear to be an underlying adrenal malignancy or other tumor. We discussed the encouraging results with the patient.  At this point, she can return to clinic as needed.  No further therapy or treatment is necessary for this and is small enough that reimaging this would only be done if she has recurrent symptoms.

## 2017-10-09 NOTE — Progress Notes (Signed)
MRN : [id number]  Evelyn Webster is a 37 y.o. (12-06-1980) female who presents with chief complaint of  Chief Complaint  Patient presents with  . Follow-up    MRI results  .  History of Present Illness: Patient returns today in follow up of an adrenal hemorrhage of the left adrenal.  This is actually our first time seeing the patient as she was admitted to the hospital and discharged before she was seen in consultation.  She had a moderate size left adrenal hemorrhage which was spontaneous.  She was having severe pain but this improved relatively quickly.  She has rare occasional pain at this point.  She had an MRI to follow-up this adrenal hemorrhage earlier this week which I have independently reviewed.  The left adrenal hemorrhage is markedly reduced in size.  Previously it measured about 40 cm in volume and now it measures only about 5 to 6 cm in volume.  There does not appear to be an underlying adrenal malignancy or other tumor.  Current Outpatient Medications  Medication Sig Dispense Refill  . acetaminophen (TYLENOL) 325 MG tablet Take 2 tablets (650 mg total) by mouth every 6 (six) hours as needed for mild pain (or Fever >/= 101). 10 tablet 0  . ALPRAZolam (XANAX) 0.5 MG tablet TK 1 T PO 1 HOUR PRIOR TO MRI AND THE 2ND T UPON ARRIVAL AT OFFICE  0  . hydrOXYzine (ATARAX/VISTARIL) 25 MG tablet Take 1 tablet (25 mg total) by mouth 3 (three) times daily as needed. 90 tablet 3  . mupirocin ointment (BACTROBAN) 2 % Apply 1 application topically 2 (two) times daily. 22 g 0  . Omega-3 Fatty Acids (FISH OIL PO) Take 1 Dose by mouth as directed.    Marland Kitchen omeprazole (PRILOSEC) 40 MG capsule Take 1 capsule (40 mg total) by mouth daily. 30 capsule 3  . sertraline (ZOLOFT) 50 MG tablet 1/2 tablet by mouth daily for 1week, then increase to 1 tablet daily for 2 weeks, then go up to 2 tabs daily 60 tablet 3   No current facility-administered medications for this visit.     Past Medical History:    Diagnosis Date  . Adrenal hemorrhage (HCC)   . Anxiety     Past Surgical History:  Procedure Laterality Date  . CESAREAN SECTION    . CHOLECYSTECTOMY    . ELBOW SURGERY Right   . FINGER SURGERY     Age 66    Social History Social History   Tobacco Use  . Smoking status: Current Every Day Smoker    Packs/day: 1.00    Years: 15.00    Pack years: 15.00  . Smokeless tobacco: Never Used  Substance Use Topics  . Alcohol use: Not Currently  . Drug use: Never      Family History Family History  Problem Relation Age of Onset  . Hypertension Other   . Diabetes Mother   . Hypertension Mother   . Stroke Mother   . Diabetes Father   . Hyperlipidemia Father   . Heart attack Maternal Uncle   . Cancer Maternal Grandmother        Lung  . Hypertension Maternal Grandmother   . Diabetes Maternal Grandmother   . Heart disease Maternal Grandfather   . Heart attack Maternal Grandfather   . Cancer Paternal Grandmother        Breast     Allergies  Allergen Reactions  . Wellbutrin [Bupropion] Rash     REVIEW OF  SYSTEMS (Negative unless checked)  Constitutional: [] Weight loss  [] Fever  [] Chills Cardiac: [] Chest pain   [] Chest pressure   [] Palpitations   [] Shortness of breath when laying flat   [] Shortness of breath at rest   [] Shortness of breath with exertion. Vascular:  [] Pain in legs with walking   [] Pain in legs at rest   [] Pain in legs when laying flat   [] Claudication   [] Pain in feet when walking  [] Pain in feet at rest  [] Pain in feet when laying flat   [] History of DVT   [] Phlebitis   [] Swelling in legs   [] Varicose veins   [] Non-healing ulcers Pulmonary:   [] Uses home oxygen   [] Productive cough   [] Hemoptysis   [] Wheeze  [] COPD   [] Asthma Neurologic:  [] Dizziness  [] Blackouts   [] Seizures   [] History of stroke   [] History of TIA  [] Aphasia   [] Temporary blindness   [] Dysphagia   [] Weakness or numbness in arms   [] Weakness or numbness in legs Musculoskeletal:   [] Arthritis   [] Joint swelling   [] Joint pain   [x] Low back pain Hematologic:  [] Easy bruising  [] Easy bleeding   [] Hypercoagulable state   [] Anemic   Gastrointestinal:  [] Blood in stool   [] Vomiting blood  [] Gastroesophageal reflux/heartburn   [x] Abdominal pain Genitourinary:  [] Chronic kidney disease   [] Difficult urination  [] Frequent urination  [] Burning with urination   [] Hematuria Skin:  [] Rashes   [] Ulcers   [] Wounds Psychological:  [x] History of anxiety   []  History of major depression.  Physical Examination  BP 121/86 (BP Location: Right Arm)   Pulse 78   Resp 18   Ht 5\' 1"  (1.549 m)   Wt 210 lb 6.4 oz (95.4 kg)   BMI 39.75 kg/m  Gen:  WD/WN, NAD Head: [place]/AT, No temporalis wasting. Ear/Nose/Throat: Hearing grossly intact, nares w/o erythema or drainage Eyes: Conjunctiva clear. Sclera non-icteric Neck: Supple.  Trachea midline Pulmonary:  Good air movement, no use of accessory muscles.  Cardiac: RRR, no JVD Vascular:  Vessel Right Left  Radial Palpable Palpable                                   Gastrointestinal: soft, non-tender/non-distended Musculoskeletal: M/S 5/5 throughout.  No deformity or atrophy. No edema. Neurologic: Sensation grossly intact in extremities.  Symmetrical.  Speech is fluent.  Psychiatric: Judgment intact, Mood & affect appropriate for pt's clinical situation. Dermatologic: No rashes or ulcers noted.  No cellulitis or open wounds.       Labs Recent Results (from the past 2160 hour(s))  Bayer DCA Hb A1c Waived     Status: None   Collection Time: 07/12/17 11:29 AM  Result Value Ref Range   HB A1C (BAYER DCA - WAIVED) 5.5 <7.0 %    Comment:                                       Diabetic Adult            <7.0                                       Healthy Adult        4.3 - 5.7                                                           (  DCCT/NGSP) American Diabetes Association's Summary of Glycemic Recommendations for Adults with  Diabetes: Hemoglobin A1c <7.0%. More stringent glycemic goals (A1c <6.0%) may further reduce complications at the cost of increased risk of hypoglycemia.   CBC with Differential/Platelet     Status: Abnormal   Collection Time: 07/12/17  1:34 PM  Result Value Ref Range   WBC 9.3 3.4 - 10.8 x10E3/uL   RBC 4.58 3.77 - 5.28 x10E6/uL   Hemoglobin 13.3 11.1 - 15.9 g/dL   Hematocrit 86.5 78.4 - 46.6 %   MCV 93 79 - 97 fL   MCH 29.0 26.6 - 33.0 pg   MCHC 31.1 (L) 31.5 - 35.7 g/dL   RDW 69.6 29.5 - 28.4 %   Platelets 331 150 - 379 x10E3/uL   Neutrophils 66 Not Estab. %   Lymphs 26 Not Estab. %   Monocytes 6 Not Estab. %   Eos 2 Not Estab. %   Basos 0 Not Estab. %   Neutrophils Absolute 6.1 1.4 - 7.0 x10E3/uL   Lymphocytes Absolute 2.4 0.7 - 3.1 x10E3/uL   Monocytes Absolute 0.6 0.1 - 0.9 x10E3/uL   EOS (ABSOLUTE) 0.2 0.0 - 0.4 x10E3/uL   Basophils Absolute 0.0 0.0 - 0.2 x10E3/uL   Immature Granulocytes 0 Not Estab. %   Immature Grans (Abs) 0.0 0.0 - 0.1 x10E3/uL  UA/M w/rflx Culture, Routine     Status: Abnormal   Collection Time: 07/20/17  3:06 PM  Result Value Ref Range   Specific Gravity, UA 1.025 1.005 - 1.030   pH, UA 5.0 5.0 - 7.5   Color, UA Yellow Yellow   Appearance Ur Clear Clear   Leukocytes, UA Negative Negative   Protein, UA Negative Negative/Trace   Glucose, UA Negative Negative   Ketones, UA Trace (A) Negative   RBC, UA 1+ (A) Negative   Bilirubin, UA Negative Negative   Urobilinogen, Ur 0.2 0.2 - 1.0 mg/dL   Nitrite, UA Negative Negative   Microscopic Examination See below:   Microscopic Examination     Status: None   Collection Time: 07/20/17  3:06 PM  Result Value Ref Range   WBC, UA None seen 0 - 5 /hpf   RBC, UA 0-2 0 - 2 /hpf   Epithelial Cells (non renal) 0-10 0 - 10 /hpf   Bacteria, UA Few None seen/Few  Cortisol     Status: None   Collection Time: 07/23/17  9:01 AM  Result Value Ref Range   Cortisol 13.8 ug/dL    Comment:                          Cortisol AM         6.2 - 19.4                         Cortisol PM         2.3 - 11.9     Radiology No results found.  Assessment/Plan  Adrenal hemorrhage (HCC) She had an MRI to follow-up this adrenal hemorrhage earlier this week which I have independently reviewed.  The left adrenal hemorrhage is markedly reduced in size.  Previously it measured about 40 cm in volume and now it measures only about 5 to 6 cm in volume.  There does not appear to be an underlying adrenal malignancy or other tumor. We discussed the encouraging results with the patient.  At this point, she can return to clinic as  needed.  No further therapy or treatment is necessary for this and is small enough that reimaging this would only be done if she has recurrent symptoms.    Festus Barren, MD  10/09/2017 12:04 PM    This note was created with Dragon medical transcription system.  Any errors from dictation are purely unintentional

## 2017-11-19 ENCOUNTER — Encounter: Payer: Self-pay | Admitting: Family Medicine

## 2017-11-19 ENCOUNTER — Ambulatory Visit (INDEPENDENT_AMBULATORY_CARE_PROVIDER_SITE_OTHER): Payer: Self-pay | Admitting: Family Medicine

## 2017-11-19 VITALS — BP 119/84 | HR 76 | Temp 97.8°F | Wt 219.1 lb

## 2017-11-19 DIAGNOSIS — J069 Acute upper respiratory infection, unspecified: Secondary | ICD-10-CM

## 2017-11-19 DIAGNOSIS — M25511 Pain in right shoulder: Secondary | ICD-10-CM

## 2017-11-19 DIAGNOSIS — F411 Generalized anxiety disorder: Secondary | ICD-10-CM

## 2017-11-19 DIAGNOSIS — F410 Panic disorder [episodic paroxysmal anxiety] without agoraphobia: Secondary | ICD-10-CM

## 2017-11-19 DIAGNOSIS — Z23 Encounter for immunization: Secondary | ICD-10-CM

## 2017-11-19 MED ORDER — OMEPRAZOLE 40 MG PO CPDR: 40.0000 mg | DELAYED_RELEASE_CAPSULE | Freq: Every day | ORAL | 3 refills | Status: DC

## 2017-11-19 MED ORDER — PREDNISONE 50 MG PO TABS: 50.0000 mg | ORAL_TABLET | Freq: Every day | ORAL | 0 refills | Status: DC

## 2017-11-19 MED ORDER — HYDROXYZINE HCL 25 MG PO TABS: 25.0000 mg | ORAL_TABLET | Freq: Three times a day (TID) | ORAL | 3 refills | Status: DC | PRN

## 2017-11-19 MED ORDER — CYCLOBENZAPRINE HCL 10 MG PO TABS: 10.0000 mg | ORAL_TABLET | Freq: Every day | ORAL | 1 refills | Status: DC

## 2017-11-19 MED ORDER — ESCITALOPRAM OXALATE 20 MG PO TABS: 20.0000 mg | ORAL_TABLET | Freq: Every day | ORAL | 3 refills | Status: DC

## 2017-11-19 NOTE — Patient Instructions (Addendum)
Influenza (Flu) Vaccine (Inactivated or Recombinant): What You Need to Know 1. Why get vaccinated? Influenza ("flu") is a contagious disease that spreads around the [place] every year, usually between October and May. Flu is caused by influenza viruses, and is spread mainly by coughing, sneezing, and close contact. Anyone can get flu. Flu strikes suddenly and can last several days. Symptoms vary by age, but can include:  fever/chills  sore throat  muscle aches  fatigue  cough  headache  runny or stuffy nose  Flu can also lead to pneumonia and blood infections, and cause diarrhea and seizures in children. If you have a medical condition, such as heart or lung disease, flu can make it worse. Flu is more dangerous for some people. Infants and young children, people 65 years of age and older, pregnant women, and people with certain health conditions or a weakened immune system are at greatest risk. Each year thousands of people in the [place] die from flu, and many more are hospitalized. Flu vaccine can:  keep you from getting flu,  make flu less severe if you do get it, and  keep you from spreading flu to your family and other people. 2. Inactivated and recombinant flu vaccines A dose of flu vaccine is recommended every flu season. Children 6 months through 8 years of age may need two doses during the same flu season. Everyone else needs only one dose each flu season. Some inactivated flu vaccines contain a very small amount of a mercury-based preservative called thimerosal. Studies have not shown thimerosal in vaccines to be harmful, but flu vaccines that do not contain thimerosal are available. There is no live flu virus in flu shots. They cannot cause the flu. There are many flu viruses, and they are always changing. Each year a new flu vaccine is made to protect against three or four viruses that are likely to cause disease in the upcoming flu season. But even when the  vaccine doesn't exactly match these viruses, it may still provide some protection. Flu vaccine cannot prevent:  flu that is caused by a virus not covered by the vaccine, or  illnesses that look like flu but are not.  It takes about 2 weeks for protection to develop after vaccination, and protection lasts through the flu season. 3. Some people should not get this vaccine Tell the person who is giving you the vaccine:  If you have any severe, life-threatening allergies. If you ever had a life-threatening allergic reaction after a dose of flu vaccine, or have a severe allergy to any part of this vaccine, you may be advised not to get vaccinated. Most, but not all, types of flu vaccine contain a small amount of egg protein.  If you ever had Guillain-Barr Syndrome (also called GBS). Some people with a history of GBS should not get this vaccine. This should be discussed with your doctor.  If you are not feeling well. It is usually okay to get flu vaccine when you have a mild illness, but you might be asked to come back when you feel better.  4. Risks of a vaccine reaction With any medicine, including vaccines, there is a chance of reactions. These are usually mild and go away on their own, but serious reactions are also possible. Most people who get a flu shot do not have any problems with it. Minor problems following a flu shot include:  soreness, redness, or swelling where the shot was given  hoarseness  sore,   red or itchy eyes  cough  fever  aches  headache  itching  fatigue  If these problems occur, they usually begin soon after the shot and last 1 or 2 days. More serious problems following a flu shot can include the following:  There may be a small increased risk of Guillain-Barre Syndrome (GBS) after inactivated flu vaccine. This risk has been estimated at 1 or 2 additional cases per million people vaccinated. This is much lower than the risk of severe complications from  flu, which can be prevented by flu vaccine.  Young children who get the flu shot along with pneumococcal vaccine (PCV13) and/or DTaP vaccine at the same time might be slightly more likely to have a seizure caused by fever. Ask your doctor for more information. Tell your doctor if a child who is getting flu vaccine has ever had a seizure.  Problems that could happen after any injected vaccine:  People sometimes faint after a medical procedure, including vaccination. Sitting or lying down for about 15 minutes can help prevent fainting, and injuries caused by a fall. Tell your doctor if you feel dizzy, or have vision changes or ringing in the ears.  Some people get severe pain in the shoulder and have difficulty moving the arm where a shot was given. This happens very rarely.  Any medication can cause a severe allergic reaction. Such reactions from a vaccine are very rare, estimated at about 1 in a million doses, and would happen within a few minutes to a few hours after the vaccination. As with any medicine, there is a very remote chance of a vaccine causing a serious injury or death. The safety of vaccines is always being monitored. For more information, visit: http://www.aguilar.org/ 5. What if there is a serious reaction? What should I look for? Look for anything that concerns you, such as signs of a severe allergic reaction, very high fever, or unusual behavior. Signs of a severe allergic reaction can include hives, swelling of the face and throat, difficulty breathing, a fast heartbeat, dizziness, and weakness. These would start a few minutes to a few hours after the vaccination. What should I do?  If you think it is a severe allergic reaction or other emergency that can't wait, call 9-1-1 and get the person to the nearest hospital. Otherwise, call your doctor.  Reactions should be reported to the Vaccine Adverse Event Reporting System (VAERS). Your doctor should file this report, or you  can do it yourself through the VAERS web site at www.vaers.SamedayNews.es, or by calling 6094730752. ? VAERS does not give medical advice. 6. The National Vaccine Injury Compensation Program The [employer] Vaccine Injury Compensation Program (VICP) is a federal program that was created to compensate people who may have been injured by certain vaccines. Persons who believe they may have been injured by a vaccine can learn about the program and about filing a claim by calling 458-267-6070 or visiting the [employer] website at GoldCloset.com.ee. There is a time limit to file a claim for compensation. 7. How can I learn more?  Ask your healthcare provider. He or she can give you the vaccine package insert or suggest other sources of information.  Call your local or state health department.  Contact the Centers for Disease Control and Prevention (CDC): ? Call (540)164-9661 (1-800-CDC-INFO) or ? Visit CDC's website at https://gibson.com/ Vaccine Information Statement, Inactivated Influenza Vaccine (10/10/2013) This information is not intended to replace advice given to you by your health care provider. Make sure  you discuss any questions you have with your health care provider. Document Released: 12/15/2005 Document Revised: 11/11/2015 Document Reviewed: 11/11/2015 Elsevier Interactive Patient Education  2017 [employer]  Shoulder Exercises Ask your health care provider which exercises are safe for you. Do exercises exactly as told by your health care provider and adjust them as directed. It is normal to feel mild stretching, pulling, tightness, or discomfort as you do these exercises, but you should stop right away if you feel sudden pain or your pain gets worse.Do not begin these exercises until told by your health care provider. RANGE OF MOTION EXERCISES These exercises warm up your muscles and joints and improve the movement and flexibility of your shoulder. These exercises also help to  relieve pain, numbness, and tingling. These exercises involve stretching your injured shoulder directly. Exercise A: Pendulum  1. Stand near a wall or a surface that you can hold onto for balance. 2. Bend at the waist and let your left / right arm hang straight down. Use your other arm to support you. Keep your back straight and do not lock your knees. 3. Relax your left / right arm and shoulder muscles, and move your hips and your trunk so your left / right arm swings freely. Your arm should swing because of the motion of your body, not because you are using your arm or shoulder muscles. 4. Keep moving your body so your arm swings in the following directions, as told by your health care provider: ? Side to side. ? Forward and backward. ? In clockwise and counterclockwise circles. 5. Continue each motion for __________ seconds, or for as long as told by your health care provider. 6. Slowly return to the starting position. Repeat __________ times. Complete this exercise __________ times a day. Exercise B:Flexion, Standing  1. Stand and hold a broomstick, a cane, or a similar object. Place your hands a little more than shoulder-width apart on the object. Your left / right hand should be palm-up, and your other hand should be palm-down. 2. Keep your elbow straight and keep your shoulder muscles relaxed. Push the stick down with your healthy arm to raise your left / right arm in front of your body, and then over your head until you feel a stretch in your shoulder. ? Avoid shrugging your shoulder while you raise your arm. Keep your shoulder blade tucked down toward the middle of your back. 3. Hold for __________ seconds. 4. Slowly return to the starting position. Repeat __________ times. Complete this exercise __________ times a day. Exercise C: Abduction, Standing 1. Stand and hold a broomstick, a cane, or a similar object. Place your hands a little more than shoulder-width apart on the object.  Your left / right hand should be palm-up, and your other hand should be palm-down. 2. While keeping your elbow straight and your shoulder muscles relaxed, push the stick across your body toward your left / right side. Raise your left / right arm to the side of your body and then over your head until you feel a stretch in your shoulder. ? Do not raise your arm above shoulder height, unless your health care provider tells you to do that. ? Avoid shrugging your shoulder while you raise your arm. Keep your shoulder blade tucked down toward the middle of your back. 3. Hold for __________ seconds. 4. Slowly return to the starting position. Repeat __________ times. Complete this exercise __________ times a day. Exercise D:Internal Rotation  1. Place your left /  right hand behind your back, palm-up. 2. Use your other hand to dangle an exercise band, a towel, or a similar object over your shoulder. Grasp the band with your left / right hand so you are holding onto both ends. 3. Gently pull up on the band until you feel a stretch in the front of your left / right shoulder. ? Avoid shrugging your shoulder while you raise your arm. Keep your shoulder blade tucked down toward the middle of your back. 4. Hold for __________ seconds. 5. Release the stretch by letting go of the band and lowering your hands. Repeat __________ times. Complete this exercise __________ times a day. STRETCHING EXERCISES These exercises warm up your muscles and joints and improve the movement and flexibility of your shoulder. These exercises also help to relieve pain, numbness, and tingling. These exercises are done using your healthy shoulder to help stretch the muscles of your injured shoulder. Exercise E: [employer] officer, [employer] party (External Rotation and Abduction)  1. Stand in a doorway with one of your feet slightly in front of the other. This is called a staggered stance. If you cannot reach your forearms to the door frame, stand facing a  corner of a room. 2. Choose one of the following positions as told by your health care provider: ? Place your hands and forearms on the door frame above your head. ? Place your hands and forearms on the door frame at the height of your head. ? Place your hands on the door frame at the height of your elbows. 3. Slowly move your weight onto your front foot until you feel a stretch across your chest and in the front of your shoulders. Keep your head and chest upright and keep your abdominal muscles tight. 4. Hold for __________ seconds. 5. To release the stretch, shift your weight to your back foot. Repeat __________ times. Complete this stretch __________ times a day. Exercise F:Extension, Standing 1. Stand and hold a broomstick, a cane, or a similar object behind your back. ? Your hands should be a little wider than shoulder-width apart. ? Your palms should face away from your back. 2. Keeping your elbows straight and keeping your shoulder muscles relaxed, move the stick away from your body until you feel a stretch in your shoulder. ? Avoid shrugging your shoulders while you move the stick. Keep your shoulder blade tucked down toward the middle of your back. 3. Hold for __________ seconds. 4. Slowly return to the starting position. Repeat __________ times. Complete this exercise __________ times a day. STRENGTHENING EXERCISES These exercises build strength and endurance in your shoulder. Endurance is the ability to use your muscles for a long time, even after they get tired. Exercise G:External Rotation  1. Sit in a stable chair without armrests. 2. Secure an exercise band at elbow height on your left / right side. 3. Place a soft object, such as a folded towel or a small pillow, between your left / right upper arm and your body to move your elbow a few inches away (about 10 cm) from your side. 4. Hold the end of the band so it is tight and there is no slack. 5. Keeping your elbow pressed  against the soft object, move your left / right forearm out, away from your abdomen. Keep your body steady so only your forearm moves. 6. Hold for __________ seconds. 7. Slowly return to the starting position. Repeat __________ times. Complete this exercise __________ times a day. Exercise H:Shoulder Abduction  1. Sit in a stable chair without armrests, or stand. 2. Hold a __________ weight in your left / right hand, or hold an exercise band with both hands. 3. Start with your arms straight down and your left / right palm facing in, toward your body. 4. Slowly lift your left / right hand out to your side. Do not lift your hand above shoulder height unless your health care provider tells you that this is safe. ? Keep your arms straight. ? Avoid shrugging your shoulder while you do this movement. Keep your shoulder blade tucked down toward the middle of your back. 5. Hold for __________ seconds. 6. Slowly lower your arm, and return to the starting position. Repeat __________ times. Complete this exercise __________ times a day. Exercise I:Shoulder Extension 1. Sit in a stable chair without armrests, or stand. 2. Secure an exercise band to a stable object in front of you where it is at shoulder height. 3. Hold one end of the exercise band in each hand. Your palms should face each other. 4. Straighten your elbows and lift your hands up to shoulder height. 5. Step back, away from the secured end of the exercise band, until the band is tight and there is no slack. 6. Squeeze your shoulder blades together as you pull your hands down to the sides of your thighs. Stop when your hands are straight down by your sides. Do not let your hands go behind your body. 7. Hold for __________ seconds. 8. Slowly return to the starting position. Repeat __________ times. Complete this exercise __________ times a day. Exercise J:Standing Shoulder Row 1. Sit in a stable chair without armrests, or  stand. 2. Secure an exercise band to a stable object in front of you so it is at waist height. 3. Hold one end of the exercise band in each hand. Your palms should be in a thumbs-up position. 4. Bend each of your elbows to an "L" shape (about 90 degrees) and keep your upper arms at your sides. 5. Step back until the band is tight and there is no slack. 6. Slowly pull your elbows back behind you. 7. Hold for __________ seconds. 8. Slowly return to the starting position. Repeat __________ times. Complete this exercise __________ times a day. Exercise K:Shoulder Press-Ups  1. Sit in a stable chair that has armrests. Sit upright, with your feet flat on the floor. 2. Put your hands on the armrests so your elbows are bent and your fingers are pointing forward. Your hands should be about even with the sides of your body. 3. Push down on the armrests and use your arms to lift yourself off of the chair. Straighten your elbows and lift yourself up as much as you comfortably can. ? Move your shoulder blades down, and avoid letting your shoulders move up toward your ears. ? Keep your feet on the ground. As you get stronger, your feet should support less of your body weight as you lift yourself up. 4. Hold for __________ seconds. 5. Slowly lower yourself back into the chair. Repeat __________ times. Complete this exercise __________ times a day. Exercise L: Wall Push-Ups  1. Stand so you are facing a stable wall. Your feet should be about one arm-length away from the wall. 2. Lean forward and place your palms on the wall at shoulder height. 3. Keep your feet flat on the floor as you bend your elbows and lean forward toward the wall. 4. Hold for __________ seconds. 5. Straighten  your elbows to push yourself back to the starting position. Repeat __________ times. Complete this exercise __________ times a day. This information is not intended to replace advice given to you by your health care provider.  Make sure you discuss any questions you have with your health care provider. Document Released: 01/04/2005 Document Revised: 11/15/2015 Document Reviewed: 11/01/2014 Elsevier Interactive Patient Education  2018 [employer].

## 2017-11-19 NOTE — Progress Notes (Signed)
BP 119/84 (BP Location: Left Arm, Patient Position: Sitting, Cuff Size: Normal)   Pulse 76   Temp 97.8 F (36.6 C)   Wt 219 lb 2 oz (99.4 kg)   SpO2 97%   BMI 41.40 kg/m    Subjective:    Patient ID: Evelyn Webster, female    DOB: 09/01/1980, 37 y.o.   MRN: [id number]  HPI: Evelyn Webster is a 37 y.o. female  Chief Complaint  Patient presents with  . Shoulder Pain    Right  . Anxiety  . URI    X 9 days   ANXIETY/STRESS- she states that her anxiety medicine is not helping. She notes that this seems to be situational and the fact that she is always working.  Duration:stable Anxious mood: yes  Excessive worrying: yes Irritability: yes  Sweating: no Nausea: no Palpitations:no Hyperventilation: no Panic attacks: no Agoraphobia: no  Obscessions/compulsions: no Depressed mood: no Depression screen [facility] 2/9 11/19/2017 08/07/2017 07/12/2017  Decreased Interest 0 0 0  Down, Depressed, Hopeless 0 0 0  PHQ - 2 Score 0 0 0  Altered sleeping 0 1 2  Tired, decreased energy 1 2 1   Change in appetite 0 0 0  Feeling bad or failure about yourself  0 0 0  Trouble concentrating 0 0 0  Moving slowly or fidgety/restless 0 0 0  Suicidal thoughts 0 0 0  PHQ-9 Score 1 3 3   Difficult doing work/chores Somewhat difficult Not difficult at all Not difficult at all   GAD 7 : Generalized Anxiety Score 11/19/2017 08/07/2017 07/12/2017  Nervous, Anxious, on Edge 3 3 2   Control/stop worrying 1 2 2   Worry too much - different things 0 1 0  Trouble relaxing 3 3 2   Restless 1 0 1  Easily annoyed or irritable 2 1 2   Afraid - awful might happen 1 0 0  Total GAD 7 Score 11 10 9   Anxiety Difficulty Somewhat difficult Somewhat difficult Somewhat difficult   Anhedonia: no Weight changes: no Insomnia: no   Hypersomnia: no Fatigue/loss of energy: yes Feelings of worthlessness: yes Feelings of guilt: no Impaired concentration/indecisiveness: no Suicidal ideations: no  Crying spells: no Recent  Stressors/Life Changes: yes   Relationship problems: no   Family stress: yes     Financial stress: yes    Job stress: yes    Recent death/loss: no  SHOULDER PAIN Duration: couple of months Involved shoulder: right Mechanism of injury: unknown Location: posterior Onset:gradual Severity: mild  Quality:  Dull pain Frequency: intermittent Radiation: no Aggravating factors: lifting  Alleviating factors: nothing  Status: stable Treatments attempted: none  Relief with NSAIDs?:  No NSAIDs Taken Weakness: no Numbness: yes in R fingers Decreased grip strength: no Redness: no Swelling: no Bruising: no Fevers: no   UPPER RESPIRATORY TRACT INFECTION Duration: 9 days Worst symptom: cough and congestion Fever: yes Cough: yes Shortness of breath: yes Wheezing: yes Chest pain: no Chest tightness: no Chest congestion: yes Nasal congestion: yes Runny nose: yes Post nasal drip: yes Sneezing: no Sore throat: no Swollen glands: no Sinus pressure: yes Headache: yes Face pain: no Toothache: no Ear pain: no  Ear pressure: no  Eyes red/itching:no Eye drainage/crusting: no  Vomiting: no Rash: no Fatigue: yes Sick contacts: yes Strep contacts: no  Context: stable Recurrent sinusitis: no Relief with OTC cold/cough medications: no  Treatments attempted: cold/sinus   Relevant past medical, surgical, family and social history reviewed and updated as indicated. Interim medical history since our last visit  reviewed. Allergies and medications reviewed and updated.  Review of Systems  Constitutional: Positive for fatigue and fever. Negative for activity change, appetite change, chills, diaphoresis and unexpected weight change.  HENT: Positive for congestion, postnasal drip, rhinorrhea and sore throat. Negative for dental problem, drooling, ear discharge, ear pain, facial swelling, hearing loss, mouth sores, nosebleeds, sinus pressure, sinus pain, sneezing, tinnitus, trouble  swallowing and voice change.   Eyes: Negative.   Respiratory: Positive for cough, shortness of breath and wheezing. Negative for apnea, choking, chest tightness and stridor.   Cardiovascular: Negative.   Gastrointestinal: Negative.   Musculoskeletal: Positive for arthralgias. Negative for back pain, gait problem, joint swelling, myalgias, neck pain and neck stiffness.  Skin: Negative.   Psychiatric/Behavioral: Negative for agitation, behavioral problems, confusion, decreased concentration, dysphoric mood, hallucinations, self-injury, sleep disturbance and suicidal ideas. The patient is nervous/anxious. The patient is not hyperactive.     Per HPI unless specifically indicated above     Objective:    BP 119/84 (BP Location: Left Arm, Patient Position: Sitting, Cuff Size: Normal)   Pulse 76   Temp 97.8 F (36.6 C)   Wt 219 lb 2 oz (99.4 kg)   SpO2 97%   BMI 41.40 kg/m   Wt Readings from Last 3 Encounters:  11/19/17 219 lb 2 oz (99.4 kg)  10/09/17 210 lb 6.4 oz (95.4 kg)  08/07/17 208 lb 5 oz (94.5 kg)    Physical Exam  Constitutional: She is oriented to person, place, and time. She appears well-developed and well-nourished. No distress.  HENT:  Head: Normocephalic and atraumatic.  Right Ear: Hearing and external ear normal.  Left Ear: Hearing and external ear normal.  Nose: Nose normal.  Mouth/Throat: Oropharynx is clear and moist. No oropharyngeal exudate.  Eyes: Pupils are equal, round, and reactive to light. Conjunctivae, EOM and lids are normal. Right eye exhibits no discharge. Left eye exhibits no discharge. No scleral icterus.  Neck: Normal range of motion. Neck supple. No JVD present. No tracheal deviation present. No thyromegaly present.  Cardiovascular: Normal rate, regular rhythm, normal heart sounds and intact distal pulses. Exam reveals no gallop and no friction rub.  No murmur heard. Pulmonary/Chest: Effort normal and breath sounds normal. No stridor. No respiratory  distress. She has no wheezes. She has no rales. She exhibits no tenderness.  Musculoskeletal:  Tenderness to paraspinals muscles medial to R scapula, FROM  Lymphadenopathy:    She has no cervical adenopathy.  Neurological: She is alert and oriented to person, place, and time.  Skin: Skin is warm, dry and intact. Capillary refill takes less than 2 seconds. No rash noted. She is not diaphoretic. No erythema. No pallor.  Psychiatric: She has a normal mood and affect. Her speech is normal and behavior is normal. Judgment and thought content normal. Cognition and memory are normal.  Nursing note and vitals reviewed.   Results for orders placed or performed in visit on 07/23/17  Cortisol  Result Value Ref Range   Cortisol 13.8 ug/dL      Assessment & Plan:   Problem List Items Addressed This Visit      Other   Generalized anxiety disorder with panic attacks - Primary    Not doing great. Will change from sertaline to lexapro and continue hydroxyzine. Recheck 1-2 months. Call with any concerns.       Relevant Medications   escitalopram (LEXAPRO) 20 MG tablet   hydrOXYzine (ATARAX/VISTARIL) 25 MG tablet    Other Visit Diagnoses  Viral upper respiratory tract infection       Will treat with prednisone for comfort. Call with any concerns. Continue to monitor.    Acute pain of right shoulder       Seems to be muscular. Will start flexeril and stretches. Call if not getting better or getting worse.    Immunization due       flu shot given today.   Relevant Orders   Flu Vaccine QUAD 6+ mos PF IM (Fluarix Quad PF) (Completed)       Follow up plan: Return 1-2 months, for follow up mood.

## 2017-11-19 NOTE — Assessment & Plan Note (Signed)
Not doing great. Will change from sertaline to lexapro and continue hydroxyzine. Recheck 1-2 months. Call with any concerns.

## 2017-11-20 ENCOUNTER — Encounter: Payer: Self-pay | Admitting: Family Medicine

## 2017-12-19 ENCOUNTER — Ambulatory Visit: Payer: Medicaid Other | Admitting: Family Medicine

## 2018-01-04 ENCOUNTER — Ambulatory Visit: Payer: Self-pay | Admitting: Family Medicine

## 2018-01-14 ENCOUNTER — Ambulatory Visit: Payer: Self-pay | Admitting: Family Medicine

## 2018-01-24 ENCOUNTER — Other Ambulatory Visit: Payer: Self-pay | Admitting: Family Medicine

## 2018-01-24 ENCOUNTER — Ambulatory Visit (INDEPENDENT_AMBULATORY_CARE_PROVIDER_SITE_OTHER): Payer: Self-pay | Admitting: Family Medicine

## 2018-01-24 ENCOUNTER — Encounter: Payer: Self-pay | Admitting: Family Medicine

## 2018-01-24 ENCOUNTER — Other Ambulatory Visit: Payer: Self-pay

## 2018-01-24 VITALS — BP 102/66 | HR 72 | Temp 97.7°F | Ht 61.0 in | Wt 223.0 lb

## 2018-01-24 DIAGNOSIS — R05 Cough: Secondary | ICD-10-CM

## 2018-01-24 DIAGNOSIS — R059 Cough, unspecified: Secondary | ICD-10-CM

## 2018-01-24 DIAGNOSIS — J209 Acute bronchitis, unspecified: Secondary | ICD-10-CM

## 2018-01-24 MED ORDER — ALBUTEROL SULFATE HFA 108 (90 BASE) MCG/ACT IN AERS: 2.0000 | INHALATION_SPRAY | Freq: Four times a day (QID) | RESPIRATORY_TRACT | 0 refills | Status: DC | PRN

## 2018-01-24 MED ORDER — AZITHROMYCIN 250 MG PO TABS: ORAL_TABLET | ORAL | 0 refills | Status: DC

## 2018-01-24 MED ORDER — PREDNISONE 10 MG PO TABS: ORAL_TABLET | ORAL | 0 refills | Status: DC

## 2018-01-24 MED ORDER — ALBUTEROL SULFATE (2.5 MG/3ML) 0.083% IN NEBU
2.5000 mg | INHALATION_SOLUTION | Freq: Once | RESPIRATORY_TRACT | Status: AC
  Administered 2018-01-24: 2.5 mg via RESPIRATORY_TRACT

## 2018-01-24 NOTE — Progress Notes (Signed)
BP 102/66   Pulse 72   Temp 97.7 F (36.5 C) (Oral)   Ht 5\' 1"  (1.549 m)   Wt 223 lb (101.2 kg)   SpO2 97%   BMI 42.14 kg/m    Subjective:    Patient ID: Evelyn Webster, female    DOB: 12/03/1980, 37 y.o.   MRN: [id number]  HPI: Evelyn Webster is a 37 y.o. female  Chief Complaint  Patient presents with  . Cough    x about a week/ pt states has being taking OTC robitussin and micinex  . Nasal Congestion   UPPER RESPIRATORY TRACT INFECTION Duration: 3-4 days (started Monday) Worst symptom: cough Fever: no Cough: yes Shortness of breath: no Wheezing: yes Chest pain: no Chest tightness: yes Chest congestion: yes Nasal congestion: yes Runny nose: yes Post nasal drip: yes Sneezing: no Sore throat: no Swollen glands: no Sinus pressure: no Headache: no Face pain: no Toothache: no Ear pain: no  Ear pressure: no  Eyes red/itching:no Eye drainage/crusting: no  Vomiting: no Rash: no Fatigue: yes Sick contacts: no Strep contacts: no  Context: stable Recurrent sinusitis: no Relief with OTC cold/cough medications: no  Treatments attempted: mucinex and cough syrup   Relevant past medical, surgical, family and social history reviewed and updated as indicated. Interim medical history since our last visit reviewed. Allergies and medications reviewed and updated.  Review of Systems  Constitutional: Positive for fatigue and fever. Negative for activity change, appetite change, chills, diaphoresis and unexpected weight change.  HENT: Negative.   Respiratory: Positive for cough, shortness of breath and wheezing. Negative for apnea, choking, chest tightness and stridor.   Cardiovascular: Negative.   Gastrointestinal: Negative.   Psychiatric/Behavioral: Negative.    Per HPI unless specifically indicated above     Objective:    BP 102/66   Pulse 72   Temp 97.7 F (36.5 C) (Oral)   Ht 5\' 1"  (1.549 m)   Wt 223 lb (101.2 kg)   SpO2 97%   BMI 42.14 kg/m   Wt  Readings from Last 3 Encounters:  01/24/18 223 lb (101.2 kg)  11/19/17 219 lb 2 oz (99.4 kg)  10/09/17 210 lb 6.4 oz (95.4 kg)    Physical Exam  Constitutional: She is oriented to person, place, and time. She appears well-developed and well-nourished. No distress.  HENT:  Head: Normocephalic and atraumatic.  Right Ear: Hearing and external ear normal.  Left Ear: Hearing and external ear normal.  Nose: Nose normal.  Mouth/Throat: Oropharynx is clear and moist. No oropharyngeal exudate.  Eyes: Pupils are equal, round, and reactive to light. Conjunctivae, EOM and lids are normal. Right eye exhibits no discharge. Left eye exhibits no discharge. No scleral icterus.  Neck: Normal range of motion. Neck supple. No JVD present. No tracheal deviation present. No thyromegaly present.  Cardiovascular: Normal rate, regular rhythm, normal heart sounds and intact distal pulses. Exam reveals no gallop and no friction rub.  No murmur heard. Pulmonary/Chest: Effort normal. No stridor. No respiratory distress. She has wheezes. She has no rales. She exhibits no tenderness.  Musculoskeletal: Normal range of motion.  Lymphadenopathy:    She has no cervical adenopathy.  Neurological: She is alert and oriented to person, place, and time.  Skin: Skin is warm, dry and intact. Capillary refill takes less than 2 seconds. No rash noted. She is not diaphoretic. No erythema. No pallor.  Psychiatric: She has a normal mood and affect. Her speech is normal and behavior is normal. Judgment and  thought content normal. Cognition and memory are normal.    Results for orders placed or performed in visit on 11/20/17  VITAMIN D 25 Hydroxy (Vit-D Deficiency, Fractures)  Result Value Ref Range   Vit D, 25-Hydroxy 26   Basic metabolic panel  Result Value Ref Range   Glucose 91    BUN 8 4 - 21   Creatinine 0.8 0.5 - 1.1   Potassium 3.3 (A) 3.4 - 5.3   Sodium 139 137 - 147  Lipid panel  Result Value Ref Range    Triglycerides 130 40 - 160   Cholesterol 153 0 - 200   HDL 35 35 - 70   LDL Cholesterol 92   Hepatic function panel  Result Value Ref Range   Alkaline Phosphatase 84 25 - 125   ALT 23 7 - 35   AST 16 13 - 35   Bilirubin, Total 0.6   Vitamin B12  Result Value Ref Range   Vitamin B-12 388   TSH  Result Value Ref Range   TSH 2.00 0.41 - 5.90      Assessment & Plan:   Problem List Items Addressed This Visit    None    Visit Diagnoses    Acute bronchitis, unspecified organism    -  Primary   Will treat with prednisone, albuterol and z-pack. Call if not getting better or getting worse. Recheck lungs in 2 weeks.    Cough       Worse after neb. Will treat with prednisone.    Relevant Medications   albuterol (PROVENTIL) (2.5 MG/3ML) 0.083% nebulizer solution 2.5 mg (Completed)       Follow up plan: Return in about 2 weeks (around 02/07/2018) for Lung recheck.

## 2018-02-05 ENCOUNTER — Ambulatory Visit: Payer: Self-pay | Admitting: Family Medicine

## 2018-02-07 ENCOUNTER — Ambulatory Visit: Payer: Self-pay | Admitting: Family Medicine

## 2018-02-07 ENCOUNTER — Encounter: Payer: Self-pay | Admitting: Family Medicine

## 2018-02-07 VITALS — BP 110/73 | HR 82 | Temp 97.5°F | Ht 61.0 in | Wt 222.4 lb

## 2018-02-07 DIAGNOSIS — J449 Chronic obstructive pulmonary disease, unspecified: Secondary | ICD-10-CM | POA: Insufficient documentation

## 2018-02-07 DIAGNOSIS — F411 Generalized anxiety disorder: Secondary | ICD-10-CM

## 2018-02-07 DIAGNOSIS — F410 Panic disorder [episodic paroxysmal anxiety] without agoraphobia: Secondary | ICD-10-CM

## 2018-02-07 MED ORDER — UMECLIDINIUM-VILANTEROL 62.5-25 MCG/INH IN AEPB: 1.0000 | INHALATION_SPRAY | Freq: Every day | RESPIRATORY_TRACT | 6 refills | Status: DC

## 2018-02-07 MED ORDER — HYDROXYZINE HCL 25 MG PO TABS: 25.0000 mg | ORAL_TABLET | Freq: Three times a day (TID) | ORAL | 3 refills | Status: DC | PRN

## 2018-02-07 MED ORDER — ALBUTEROL SULFATE HFA 108 (90 BASE) MCG/ACT IN AERS: 2.0000 | INHALATION_SPRAY | Freq: Four times a day (QID) | RESPIRATORY_TRACT | 3 refills | Status: DC | PRN

## 2018-02-07 MED ORDER — ESCITALOPRAM OXALATE 20 MG PO TABS: 20.0000 mg | ORAL_TABLET | Freq: Every day | ORAL | 1 refills | Status: DC

## 2018-02-07 NOTE — Assessment & Plan Note (Signed)
Doing better on her lexapro, but making her sleepy- start taking it at night. Continue to monitor.

## 2018-02-07 NOTE — Progress Notes (Signed)
BP 110/73 (BP Location: Left Arm, Patient Position: Sitting, Cuff Size: Large)   Pulse 82   Temp (!) 97.5 F (36.4 C)   Ht 5\' 1"  (1.549 m)   Wt 222 lb 7 oz (100.9 kg)   SpO2 97%   BMI 42.03 kg/m    Subjective:    Patient ID: Evelyn Webster, female    DOB: 09/09/1980, 37 y.o.   MRN: [id number]  HPI: Evelyn Webster is a 37 y.o. female  Chief Complaint  Patient presents with  . panic attacks   Still a little short of breath. Still coughing a bit. No fevers, no wheezing.   ANXIETY/STRESS- better, Only taking 1/2 of her lexapro because it makes her sleepy- taking it in the AM Duration:better Anxious mood: yes  Excessive worrying: yes Irritability: yes  Sweating: no Nausea: no Palpitations:no Hyperventilation: no Panic attacks: no Agoraphobia: no  Obscessions/compulsions: no Depressed mood: no [facility] 02/07/2018 11/19/2017 08/07/2017 07/12/2017  Decreased Interest 0 0 0 0  Down, Depressed, Hopeless 0 0 0 0  PHQ - 2 Score 0 0 0 0  Altered sleeping 0 0 1 2  Tired, decreased energy 0 1 2 1   Change in appetite 0 0 0 0  Feeling bad or failure about yourself  0 0 0 0  Trouble concentrating 0 0 0 0  Moving slowly or fidgety/restless 0 0 0 0  Suicidal thoughts 0 0 0 0  PHQ-9 Score 0 1 3 3   Difficult doing work/chores Not difficult at all Somewhat difficult Not difficult at all Not difficult at all   GAD 7 : Generalized Anxiety Score 02/07/2018 11/19/2017 08/07/2017 07/12/2017  Nervous, Anxious, on Edge 1 3 3 2   Control/stop worrying 1 1 2 2   Worry too much - different things 1 0 1 0  Trouble relaxing 2 3 3 2   Restless 1 1 0 1  Easily annoyed or irritable 1 2 1 2   Afraid - awful might happen 0 1 0 0  Total GAD 7 Score 7 11 10 9   Anxiety Difficulty Somewhat difficult Somewhat difficult Somewhat difficult Somewhat difficult   Anhedonia: no Weight changes: no Insomnia: no   Hypersomnia: yes Fatigue/loss of energy: yes Feelings of worthlessness: no Feelings of  guilt: no Impaired concentration/indecisiveness: no Suicidal ideations: no  Crying spells: no Recent Stressors/Life Changes: no   Relationship problems: no   Family stress: no     Financial stress: no    Job stress: no    Recent death/loss: no  Relevant past medical, surgical, family and social history reviewed and updated as indicated. Interim medical history since our last visit reviewed. Allergies and medications reviewed and updated.  Review of Systems  Constitutional: Negative.   Respiratory: Positive for cough and shortness of breath. Negative for apnea, choking, chest tightness, wheezing and stridor.   Cardiovascular: Negative.   Skin: Negative.   Neurological: Negative.   Psychiatric/Behavioral: Negative for agitation, behavioral problems, confusion, decreased concentration, dysphoric mood, hallucinations, self-injury, sleep disturbance and suicidal ideas. The patient is nervous/anxious. The patient is not hyperactive.     Per HPI unless specifically indicated above     Objective:    BP 110/73 (BP Location: Left Arm, Patient Position: Sitting, Cuff Size: Large)   Pulse 82   Temp (!) 97.5 F (36.4 C)   Ht 5\' 1"  (1.549 m)   Wt 222 lb 7 oz (100.9 kg)   SpO2 97%   BMI 42.03 kg/m   Wt Readings from  Last 3 Encounters:  02/07/18 222 lb 7 oz (100.9 kg)  01/24/18 223 lb (101.2 kg)  11/19/17 219 lb 2 oz (99.4 kg)    Physical Exam  Constitutional: She is oriented to person, place, and time. She appears well-developed and well-nourished. No distress.  HENT:  Head: Normocephalic and atraumatic.  Right Ear: Hearing normal.  Left Ear: Hearing normal.  Nose: Nose normal.  Eyes: Conjunctivae and lids are normal. Right eye exhibits no discharge. Left eye exhibits no discharge. No scleral icterus.  Cardiovascular: Normal rate, regular rhythm, normal heart sounds and intact distal pulses. Exam reveals no gallop and no friction rub.  No murmur heard. Pulmonary/Chest: Effort  normal and breath sounds normal. No stridor. No respiratory distress. She has no wheezes. She has no rales. She exhibits no tenderness.  Musculoskeletal: Normal range of motion.  Neurological: She is alert and oriented to person, place, and time.  Skin: Skin is warm, dry and intact. Capillary refill takes less than 2 seconds. No rash noted. She is not diaphoretic. No erythema. No pallor.  Psychiatric: She has a normal mood and affect. Her speech is normal and behavior is normal. Judgment and thought content normal. Cognition and memory are normal.  Nursing note and vitals reviewed.   Results for orders placed or performed in visit on 11/20/17  VITAMIN D 25 Hydroxy (Vit-D Deficiency, Fractures)  Result Value Ref Range   Vit D, 25-Hydroxy 26   Basic metabolic panel  Result Value Ref Range   Glucose 91    BUN 8 4 - 21   Creatinine 0.8 0.5 - 1.1   Potassium 3.3 (A) 3.4 - 5.3   Sodium 139 137 - 147  Lipid panel  Result Value Ref Range   Triglycerides 130 40 - 160   Cholesterol 153 0 - 200   HDL 35 35 - 70   LDL Cholesterol 92   Hepatic function panel  Result Value Ref Range   Alkaline Phosphatase 84 25 - 125   ALT 23 7 - 35   AST 16 13 - 35   Bilirubin, Total 0.6   Vitamin B12  Result Value Ref Range   Vitamin B-12 388   TSH  Result Value Ref Range   TSH 2.00 0.41 - 5.90      Assessment & Plan:   Problem List Items Addressed This Visit      Respiratory   Chronic obstructive pulmonary disease (HCC) - Primary    Doing better after bronchitis, but still coughing. Will start anoro and recheck 46month. Call with any concerns.       Relevant Medications   umeclidinium-vilanterol (ANORO ELLIPTA) 62.5-25 MCG/INH AEPB   albuterol (PROVENTIL HFA;VENTOLIN HFA) 108 (90 Base) MCG/ACT inhaler     Other   Generalized anxiety disorder with panic attacks    Doing better on her lexapro, but making her sleepy- start taking it at night. Continue to monitor.       Relevant Medications     hydrOXYzine (ATARAX/VISTARIL) 25 MG tablet   escitalopram (LEXAPRO) 20 MG tablet       Follow up plan: Return in about 4 weeks (around 03/07/2018) for Follow up breathing.

## 2018-02-07 NOTE — Assessment & Plan Note (Signed)
Doing better after bronchitis, but still coughing. Will start anoro and recheck 64month. Call with any concerns.

## 2018-03-01 ENCOUNTER — Other Ambulatory Visit: Payer: Self-pay | Admitting: Family Medicine

## 2018-03-01 NOTE — Telephone Encounter (Signed)
Requested medication (s) are due for refill today: no  Requested medication (s) are on the active medication list: no  Last refill:  01/24/18  Future visit scheduled: in 1 week  Notes to clinic:  Last OV 02/07/18   Requested Prescriptions  Pending Prescriptions Disp Refills   predniSONE (STERAPRED UNI-PAK 21 TAB) 10 MG (21) TBPK tablet [Pharmacy Med Name: PREDNISONE 10MG  TABS PACK 21'S] 21 tablet     Sig: TAKE AS DIRECTED     Not Delegated - Endocrinology:  Oral Corticosteroids Failed - 03/01/2018 12:01 PM      Failed - This refill cannot be delegated      Passed - Last BP in normal range    BP Readings from Last 1 Encounters:  02/07/18 110/73         Passed - Valid encounter within last 6 months    Recent Outpatient Visits          3 weeks ago Chronic obstructive pulmonary disease, unspecified COPD type (HCC)   [facility] [place], [place]   1 month ago Acute bronchitis, unspecified organism   [facility]   3 months ago Generalized anxiety disorder with panic attacks   [facility] P, DO   6 months ago Generalized anxiety disorder with panic attacks   [facility] [place], [place]   7 months ago Epigastric pain   [facility] Gabriel CirriWicker, Cheryl, NP      Future Appointments            In 1 week La[facility] BenesJohnson, Oralia RudMegan P, DO [facility]

## 2018-03-04 NOTE — Telephone Encounter (Signed)
Called pt to advise that we are unable to refill prednisone. She has an appt scheduled for 03/14/18 was going to see if she wanted to see about rescheduling.

## 2018-03-14 ENCOUNTER — Ambulatory Visit: Payer: Self-pay | Admitting: Family Medicine

## 2018-03-15 ENCOUNTER — Encounter: Payer: Self-pay | Admitting: Family Medicine

## 2018-04-09 ENCOUNTER — Ambulatory Visit (INDEPENDENT_AMBULATORY_CARE_PROVIDER_SITE_OTHER): Payer: Self-pay | Admitting: Family Medicine

## 2018-04-09 ENCOUNTER — Encounter: Payer: Self-pay | Admitting: Family Medicine

## 2018-04-09 VITALS — BP 113/78 | HR 98 | Temp 97.9°F | Ht 61.0 in | Wt 227.0 lb

## 2018-04-09 DIAGNOSIS — F411 Generalized anxiety disorder: Secondary | ICD-10-CM

## 2018-04-09 DIAGNOSIS — F410 Panic disorder [episodic paroxysmal anxiety] without agoraphobia: Secondary | ICD-10-CM

## 2018-04-09 DIAGNOSIS — M722 Plantar fascial fibromatosis: Secondary | ICD-10-CM

## 2018-04-09 MED ORDER — HYDROXYZINE HCL 25 MG PO TABS: 25.0000 mg | ORAL_TABLET | Freq: Three times a day (TID) | ORAL | 3 refills | Status: DC | PRN

## 2018-04-09 MED ORDER — ARIPIPRAZOLE 5 MG PO TABS: 5.0000 mg | ORAL_TABLET | Freq: Every day | ORAL | 3 refills | Status: DC

## 2018-04-09 MED ORDER — NAPROXEN 500 MG PO TABS: 500.0000 mg | ORAL_TABLET | Freq: Two times a day (BID) | ORAL | 3 refills | Status: DC

## 2018-04-09 NOTE — Patient Instructions (Signed)
Plantar Fasciitis    Plantar fasciitis is a painful foot condition that affects the heel. It occurs when the band of tissue that connects the toes to the heel bone (plantar fascia) becomes irritated. This can happen as the result of exercising too much or doing other repetitive activities (overuse injury).  The pain from plantar fasciitis can range from mild irritation to severe pain that makes it difficult to walk or move. The pain is usually worse in the morning after sleeping, or after sitting or lying down for a while. Pain may also be worse after long periods of walking or standing.  What are the causes?  This condition may be caused by:   Standing for long periods of time.   Wearing shoes that do not have good arch support.   Doing activities that put stress on joints (high-impact activities), including running, aerobics, and ballet.   Being overweight.   An abnormal way of walking (gait).   Tight muscles in the back of your lower leg (calf).   High arches in your feet.   Starting a new athletic activity.  What are the signs or symptoms?  The main symptom of this condition is heel pain. Pain may:   Be worse with first steps after a time of rest, especially in the morning after sleeping or after you have been sitting or lying down for a while.   Be worse after long periods of standing still.   Decrease after 30-45 minutes of activity, such as gentle walking.  How is this diagnosed?  This condition may be diagnosed based on your medical history and your symptoms. Your health care provider may ask questions about your activity level. Your health care provider will do a physical exam to check for:   A tender area on the bottom of your foot.   A high arch in your foot.   Pain when you move your foot.   Difficulty moving your foot.  You may have imaging tests to confirm the diagnosis, such as:   X-rays.   Ultrasound.   MRI.  How is this treated?  Treatment for plantar fasciitis depends on how  severe your condition is. Treatment may include:   Rest, ice, applying pressure (compression), and raising the affected foot (elevation). This may be called RICE therapy. Your health care provider may recommend RICE therapy along with over-the-counter pain medicines to manage your pain.   Exercises to stretch your calves and your plantar fascia.   A splint that holds your foot in a stretched, upward position while you sleep (night splint).   Physical therapy to relieve symptoms and prevent problems in the future.   Injections of steroid medicine (cortisone) to relieve pain and inflammation.   Stimulating your plantar fascia with electrical impulses (extracorporeal shock wave therapy). This is usually the last treatment option before surgery.   Surgery, if other treatments have not worked after 12 months.  Follow these instructions at home:    Managing pain, stiffness, and swelling   If directed, put ice on the painful area:  ? Put ice in a plastic bag, or use a frozen bottle of water.  ? Place a towel between your skin and the bag or bottle.  ? Roll the bottom of your foot over the bag or bottle.  ? Do this for 20 minutes, 2-3 times a day.   Wear athletic shoes that have air-sole or gel-sole cushions, or try wearing soft shoe inserts that are designed for plantar   fasciitis.   Raise (elevate) your foot above the level of your heart while you are sitting or lying down.  Activity   Avoid activities that cause pain. Ask your health care provider what activities are safe for you.   Do physical therapy exercises and stretches as told by your health care provider.   Try activities and forms of exercise that are easier on your joints (low-impact). Examples include swimming, water aerobics, and biking.  General instructions   Take over-the-counter and prescription medicines only as told by your health care provider.   Wear a night splint while sleeping, if told by your health care provider. Loosen the splint  if your toes tingle, become numb, or turn cold and blue.   Maintain a healthy weight, or work with your health care provider to lose weight as needed.   Keep all follow-up visits as told by your health care provider. This is important.  Contact a health care provider if you:   Have symptoms that do not go away after caring for yourself at home.   Have pain that gets worse.   Have pain that affects your ability to move or do your daily activities.  Summary   Plantar fasciitis is a painful foot condition that affects the heel. It occurs when the band of tissue that connects the toes to the heel bone (plantar fascia) becomes irritated.   The main symptom of this condition is heel pain that may be worse after exercising too much or standing still for a long time.   Treatment varies, but it usually starts with rest, ice, compression, and elevation (RICE therapy) and over-the-counter medicines to manage pain.  This information is not intended to replace advice given to you by your health care provider. Make sure you discuss any questions you have with your health care provider.  Document Released: 11/15/2000 Document Revised: 12/18/2016 Document Reviewed: 12/18/2016  Elsevier Interactive Patient Education  2019 [employer]        Plantar Fasciitis Rehab  Ask your health care provider which exercises are safe for you. Do exercises exactly as told by your health care provider and adjust them as directed. It is normal to feel mild stretching, pulling, tightness, or discomfort as you do these exercises, but you should stop right away if you feel sudden pain or your pain gets worse. Do not begin these exercises until told by your health care provider.  Stretching and range of motion exercises  These exercises warm up your muscles and joints and improve the movement and flexibility of your foot. These exercises also help to relieve pain.  Exercise A: Plantar fascia stretch    1. Sit with your left / right leg crossed  over your opposite knee.  2. Hold your heel with one hand with that thumb near your arch. With your other hand, hold your toes and gently pull them back toward the top of your foot. You should feel a stretch on the bottom of your toes or your foot or both.  3. Hold this stretch for__________ seconds.  4. Slowly release your toes and return to the starting position.  Repeat __________ times. Complete this exercise __________ times a day.  Exercise B: Gastroc, standing    1. Stand with your hands against a wall.  2. Extend your left / right leg behind you, and bend your front knee slightly.  3. Keeping your heels on the floor and keeping your back knee straight, shift your weight toward the   wall without arching your back. You should feel a gentle stretch in your left / right calf.  4. Hold this position for __________ seconds.  Repeat __________ times. Complete this exercise __________ times a day.  Exercise C: Soleus, standing  1. Stand with your hands against a wall.  2. Extend your left / right leg behind you, and bend your front knee slightly.  3. Keeping your heels on the floor, bend your back knee and slightly shift your weight over the back leg. You should feel a gentle stretch deep in your calf.  4. Hold this position for __________ seconds.  Repeat __________ times. Complete this exercise __________ times a day.  Exercise D: Gastrocsoleus, standing  1. Stand with the ball of your left / right foot on a step. The ball of your foot is on the walking surface, right under your toes.  2. Keep your other foot firmly on the same step.  3. Hold onto the wall or a railing for balance.  4. Slowly lift your other foot, allowing your body weight to press your heel down over the edge of the step. You should feel a stretch in your left / right calf.  5. Hold this position for __________ seconds.  6. Return both feet to the step.  7. Repeat this exercise with a slight bend in your left / right knee.  Repeat __________ times  with your left / right knee straight and __________ times with your left / right knee bent. Complete this exercise __________ times a day.  Balance exercise  This exercise builds your balance and strength control of your arch to help take pressure off your plantar fascia.  Exercise E: Single leg stand  1. Without shoes, stand near a railing or in a doorway. You may hold onto the railing or door frame as needed.  2. Stand on your left / right foot. Keep your big toe down on the floor and try to keep your arch lifted. Do not let your foot roll inward.  3. Hold this position for __________ seconds.  4. If this exercise is too easy, you can try it with your eyes closed or while standing on a pillow.  Repeat __________ times. Complete this exercise __________ times a day.  This information is not intended to replace advice given to you by your health care provider. Make sure you discuss any questions you have with your health care provider.  Document Released: 02/20/2005 Document Revised: 10/26/2015 Document Reviewed: 01/04/2015  Elsevier Interactive Patient Education  2019 [employer]

## 2018-04-09 NOTE — Progress Notes (Signed)
BP 113/78   Pulse 98   Temp 97.9 F (36.6 C) (Oral)   Ht 5\' 1"  (1.549 m)   Wt 227 lb (103 kg)   SpO2 98%   BMI 42.89 kg/m    Subjective:    Patient ID: Evelyn Webster, female    DOB: 1980/04/14, 38 y.o.   MRN: [id number]  HPI: Evelyn Webster is a 38 y.o. female  Chief Complaint  Patient presents with  . Anxiety    Medication does not help. Symptoms worsening  . Foot Injury    Left foot, heel. Very sore in the morning and during work. Difficult to stand on foot.    ANXIETY/STRESS- mood is doing a bit better, but the hydroxyzine is not helping with the anxiety attacks a lot- but the anxiety attacks are coming a lot less often Duration:better Anxious mood: yes  Excessive worrying: yes Irritability: yes  Sweating: no Nausea: no Palpitations:no Hyperventilation: no Panic attacks: yes Agoraphobia: no  Obscessions/compulsions: no Depressed mood: no Depression screen [facility] 2/9 04/09/2018 02/07/2018 11/19/2017 08/07/2017 07/12/2017  Decreased Interest 0 0 0 0 0  Down, Depressed, Hopeless 0 0 0 0 0  PHQ - 2 Score 0 0 0 0 0  Altered sleeping 0 0 0 1 2  Tired, decreased energy 3 0 1 2 1   Change in appetite 0 0 0 0 0  Feeling bad or failure about yourself  0 0 0 0 0  Trouble concentrating 0 0 0 0 0  Moving slowly or fidgety/restless 0 0 0 0 0  Suicidal thoughts 0 0 0 0 0  PHQ-9 Score 3 0 1 3 3   Difficult doing work/chores - Not difficult at all Somewhat difficult Not difficult at all Not difficult at all   GAD 7 : Generalized Anxiety Score 04/09/2018 02/07/2018 11/19/2017 08/07/2017  Nervous, Anxious, on Edge 2 1 3 3   Control/stop worrying 1 1 1 2   Worry too much - different things 1 1 0 1  Trouble relaxing 3 2 3 3   Restless 2 1 1  0  Easily annoyed or irritable 3 1 2 1   Afraid - awful might happen 0 0 1 0  Total GAD 7 Score 12 7 11 10   Anxiety Difficulty - Somewhat difficult Somewhat difficult Somewhat difficult   Anhedonia: no Weight changes: no Insomnia: no   Hypersomnia:  yes Fatigue/loss of energy: yes Feelings of worthlessness: yes Feelings of guilt: no Impaired concentration/indecisiveness: no Suicidal ideations: no  Crying spells: no Recent Stressors/Life Changes: no   Relationship problems: no   Family stress: no     Financial stress: no    Job stress: no    Recent death/loss: no  FOOT PAIN Duration: 2 moths Involved foot: left Mechanism of injury: unknown Location: heel Onset: sudden  Severity: severe  Quality:  Aching and sore Frequency: worse with walking on it Radiation: no Aggravating factors: weight bearing and walking  Alleviating factors: rest  Status: worse Treatments attempted: rest and ibuprofen  Relief with NSAIDs?:  no Weakness with weight bearing or walking: no Morning stiffness: no Swelling: no Redness: no Bruising: no Paresthesias / decreased sensation: no  Fevers:no  Relevant past medical, surgical, family and social history reviewed and updated as indicated. Interim medical history since our last visit reviewed. Allergies and medications reviewed and updated.  Review of Systems  Constitutional: Negative.   Respiratory: Negative.   Cardiovascular: Negative.   Musculoskeletal: Positive for myalgias. Negative for arthralgias, back pain, gait problem, joint swelling, neck  pain and neck stiffness.  Skin: Negative.   Psychiatric/Behavioral: Negative for agitation, behavioral problems, confusion, decreased concentration, dysphoric mood, hallucinations, self-injury, sleep disturbance and suicidal ideas. The patient is nervous/anxious. The patient is not hyperactive.     Per HPI unless specifically indicated above     Objective:    BP 113/78   Pulse 98   Temp 97.9 F (36.6 C) (Oral)   Ht 5\' 1"  (1.549 m)   Wt 227 lb (103 kg)   SpO2 98%   BMI 42.89 kg/m   Wt Readings from Last 3 Encounters:  04/09/18 227 lb (103 kg)  02/07/18 222 lb 7 oz (100.9 kg)  01/24/18 223 lb (101.2 kg)    Physical Exam Vitals  signs and nursing note reviewed.  Constitutional:      General: She is not in acute distress.    Appearance: Normal appearance. She is not ill-appearing, toxic-appearing or diaphoretic.  HENT:     Head: Normocephalic and atraumatic.     Right Ear: External ear normal.     Left Ear: External ear normal.     Nose: Nose normal.     Mouth/Throat:     Mouth: Mucous membranes are moist.     Pharynx: Oropharynx is clear.  Eyes:     General: No scleral icterus.       Right eye: No discharge.        Left eye: No discharge.     Extraocular Movements: Extraocular movements intact.     Conjunctiva/sclera: Conjunctivae normal.     Pupils: Pupils are equal, round, and reactive to light.  Neck:     Musculoskeletal: Normal range of motion and neck supple.  Cardiovascular:     Rate and Rhythm: Normal rate and regular rhythm.     Pulses: Normal pulses.     Heart sounds: Normal heart sounds. No murmur. No friction rub. No gallop.   Pulmonary:     Effort: Pulmonary effort is normal. No respiratory distress.     Breath sounds: Normal breath sounds. No stridor. No wheezing, rhonchi or rales.  Chest:     Chest wall: No tenderness.  Musculoskeletal: Normal range of motion.        General: Tenderness (L heel) present.  Skin:    General: Skin is warm and dry.     Capillary Refill: Capillary refill takes less than 2 seconds.     Coloration: Skin is not jaundiced or pale.     Findings: No bruising, erythema, lesion or rash.  Neurological:     General: No focal deficit present.     Mental Status: She is alert and oriented to person, place, and time. Mental status is at baseline.  Psychiatric:        Mood and Affect: Mood normal.        Behavior: Behavior normal.        Thought Content: Thought content normal.        Judgment: Judgment normal.     Results for orders placed or performed in visit on 11/20/17  VITAMIN D 25 Hydroxy (Vit-D Deficiency, Fractures)  Result Value Ref Range   Vit D,  25-Hydroxy 26   Basic metabolic panel  Result Value Ref Range   Glucose 91    BUN 8 4 - 21   Creatinine 0.8 0.5 - 1.1   Potassium 3.3 (A) 3.4 - 5.3   Sodium 139 137 - 147  Lipid panel  Result Value Ref Range   Triglycerides 130 40 -  160   Cholesterol 153 0 - 200   HDL 35 35 - 70   LDL Cholesterol 92   Hepatic function panel  Result Value Ref Range   Alkaline Phosphatase 84 25 - 125   ALT 23 7 - 35   AST 16 13 - 35   Bilirubin, Total 0.6   Vitamin B12  Result Value Ref Range   Vitamin B-12 388   TSH  Result Value Ref Range   TSH 2.00 0.41 - 5.90      Assessment & Plan:   Problem List Items Addressed This Visit      Other   Generalized anxiety disorder with panic attacks - Primary    Happening less often, but not particularly better. Will continue lexapro and add abilify and recheck in 1 month. Increase hydroxyzine to 5-100mg  PRN. Recheck 1 month. Call with any concerns.       Relevant Medications   hydrOXYzine (ATARAX/VISTARIL) 25 MG tablet    Other Visit Diagnoses    Plantar fasciitis       Will treat with exercises and naproxen. Call if not getting better or getting worse, we will get her into podiatry. Call with any concerns.        Follow up plan: Return in about 4 weeks (around 05/07/2018).

## 2018-04-10 ENCOUNTER — Encounter: Payer: Self-pay | Admitting: Family Medicine

## 2018-04-10 NOTE — Assessment & Plan Note (Signed)
Happening less often, but not particularly better. Will continue lexapro and add abilify and recheck in 1 month. Increase hydroxyzine to 5-100mg  PRN. Recheck 1 month. Call with any concerns.

## 2018-04-15 ENCOUNTER — Encounter: Payer: Self-pay | Admitting: Family Medicine

## 2018-04-15 ENCOUNTER — Ambulatory Visit (INDEPENDENT_AMBULATORY_CARE_PROVIDER_SITE_OTHER): Payer: Self-pay | Admitting: Family Medicine

## 2018-04-15 VITALS — BP 119/82 | HR 96 | Temp 97.9°F | Wt 227.2 lb

## 2018-04-15 DIAGNOSIS — R6883 Chills (without fever): Secondary | ICD-10-CM

## 2018-04-15 DIAGNOSIS — J069 Acute upper respiratory infection, unspecified: Secondary | ICD-10-CM

## 2018-04-15 LAB — VERITOR FLU A/B WAIVED
Influenza A: NEGATIVE
Influenza B: NEGATIVE

## 2018-04-15 MED ORDER — HYDROCOD POLST-CPM POLST ER 10-8 MG/5ML PO SUER: 5.0000 mL | Freq: Every evening | ORAL | 0 refills | Status: DC | PRN

## 2018-04-15 MED ORDER — BENZONATATE 200 MG PO CAPS: 200.0000 mg | ORAL_CAPSULE | Freq: Two times a day (BID) | ORAL | 0 refills | Status: DC | PRN

## 2018-04-15 MED ORDER — PREDNISONE 50 MG PO TABS: 50.0000 mg | ORAL_TABLET | Freq: Every day | ORAL | 0 refills | Status: DC

## 2018-04-15 NOTE — Progress Notes (Signed)
BP 119/82   Pulse 96   Temp 97.9 F (36.6 C) (Oral)   Wt 227 lb 3.2 oz (103.1 kg)   SpO2 95%   BMI 42.93 kg/m    Subjective:    Patient ID: Evelyn Webster, female    DOB: 27-Jun-1980, 38 y.o.   MRN: [id number]  HPI: Evelyn Webster is a 38 y.o. female  Chief Complaint  Patient presents with  . URI    pt states she has had a cough, runny nose, chills, and congestion since Saturday night    UPPER RESPIRATORY TRACT INFECTION Duration: 2 days Worst symptom: cough Fever: no Cough: yes Shortness of breath: no Wheezing: no Chest pain: no Chest tightness: no Chest congestion: no Nasal congestion: yes Runny nose: yes Post nasal drip: yes Sneezing: yes Sore throat: yes Swollen glands: no Sinus pressure: no Headache: no Face pain: no Toothache: no Ear pain: no  Ear pressure: no  Eyes red/itching:no Eye drainage/crusting: no  Vomiting: no Rash: no Fatigue: yes Sick contacts: yes Strep contacts: no  Context: worse Recurrent sinusitis: no Relief with OTC cold/cough medications: no  Treatments attempted: cough syrup   Relevant past medical, surgical, family and social history reviewed and updated as indicated. Interim medical history since our last visit reviewed. Allergies and medications reviewed and updated.  Review of Systems  Constitutional: Positive for chills and fatigue. Negative for activity change, appetite change, diaphoresis, fever and unexpected weight change.  HENT: Positive for congestion, postnasal drip, rhinorrhea and sore throat. Negative for dental problem, drooling, ear discharge, ear pain, facial swelling, hearing loss, mouth sores, nosebleeds, sinus pressure, sinus pain, sneezing, tinnitus, trouble swallowing and voice change.   Eyes: Negative.   Respiratory: Positive for cough. Negative for apnea, choking, chest tightness, shortness of breath, wheezing and stridor.   Cardiovascular: Negative.   Gastrointestinal: Negative.   Neurological:  Negative.   Psychiatric/Behavioral: Negative.     Per HPI unless specifically indicated above     Objective:    BP 119/82   Pulse 96   Temp 97.9 F (36.6 C) (Oral)   Wt 227 lb 3.2 oz (103.1 kg)   SpO2 95%   BMI 42.93 kg/m   Wt Readings from Last 3 Encounters:  04/15/18 227 lb 3.2 oz (103.1 kg)  04/09/18 227 lb (103 kg)  02/07/18 222 lb 7 oz (100.9 kg)    Physical Exam Vitals signs and nursing note reviewed.  Constitutional:      General: She is not in acute distress.    Appearance: Normal appearance. She is not ill-appearing, toxic-appearing or diaphoretic.  HENT:     Head: Normocephalic and atraumatic.     Right Ear: Tympanic membrane, ear canal and external ear normal. There is no impacted cerumen.     Left Ear: Tympanic membrane, ear canal and external ear normal. There is no impacted cerumen.     Nose: Congestion and rhinorrhea present.     Mouth/Throat:     Mouth: Mucous membranes are moist.     Pharynx: Oropharynx is clear. No oropharyngeal exudate or posterior oropharyngeal erythema.  Eyes:     General: No scleral icterus.       Right eye: No discharge.        Left eye: No discharge.     Extraocular Movements: Extraocular movements intact.     Conjunctiva/sclera: Conjunctivae normal.     Pupils: Pupils are equal, round, and reactive to light.  Neck:     Musculoskeletal: Normal range of motion  and neck supple. No neck rigidity or muscular tenderness.     Vascular: No carotid bruit.  Cardiovascular:     Rate and Rhythm: Normal rate and regular rhythm.     Pulses: Normal pulses.     Heart sounds: Normal heart sounds. No murmur. No friction rub. No gallop.   Pulmonary:     Effort: Pulmonary effort is normal. No respiratory distress.     Breath sounds: Normal breath sounds. No stridor. No wheezing, rhonchi or rales.  Chest:     Chest wall: No tenderness.  Musculoskeletal: Normal range of motion.  Lymphadenopathy:     Cervical: Cervical adenopathy present.    Skin:    General: Skin is warm and dry.     Capillary Refill: Capillary refill takes less than 2 seconds.     Coloration: Skin is not jaundiced or pale.     Findings: No bruising, erythema, lesion or rash.  Neurological:     General: No focal deficit present.     Mental Status: She is alert and oriented to person, place, and time. Mental status is at baseline.     Cranial Nerves: No cranial nerve deficit.     Sensory: No sensory deficit.     Motor: No weakness.     Coordination: Coordination normal.     Gait: Gait normal.     Deep Tendon Reflexes: Reflexes normal.  Psychiatric:        Mood and Affect: Mood normal.        Behavior: Behavior normal.        Thought Content: Thought content normal.        Judgment: Judgment normal.     Results for orders placed or performed in visit on 11/20/17  VITAMIN D 25 Hydroxy (Vit-D Deficiency, Fractures)  Result Value Ref Range   Vit D, 25-Hydroxy 26   Basic metabolic panel  Result Value Ref Range   Glucose 91    BUN 8 4 - 21   Creatinine 0.8 0.5 - 1.1   Potassium 3.3 (A) 3.4 - 5.3   Sodium 139 137 - 147  Lipid panel  Result Value Ref Range   Triglycerides 130 40 - 160   Cholesterol 153 0 - 200   HDL 35 35 - 70   LDL Cholesterol 92   Hepatic function panel  Result Value Ref Range   Alkaline Phosphatase 84 25 - 125   ALT 23 7 - 35   AST 16 13 - 35   Bilirubin, Total 0.6   Vitamin B12  Result Value Ref Range   Vitamin B-12 388   TSH  Result Value Ref Range   TSH 2.00 0.41 - 5.90      Assessment & Plan:   Problem List Items Addressed This Visit    None    Visit Diagnoses    Viral URI    -  Primary   Will treat with prednisone, tussionex and tessalon perles. Call with any concerns or if not getting better.    Chills       Flu negative.    Relevant Orders   Veritor Flu A/B Waived       Follow up plan: Return if symptoms worsen or fail to improve.

## 2018-08-09 ENCOUNTER — Other Ambulatory Visit: Payer: Self-pay

## 2018-08-09 ENCOUNTER — Ambulatory Visit (INDEPENDENT_AMBULATORY_CARE_PROVIDER_SITE_OTHER): Payer: PRIVATE HEALTH INSURANCE | Admitting: Family Medicine

## 2018-08-09 ENCOUNTER — Encounter: Payer: Self-pay | Admitting: Family Medicine

## 2018-08-09 VITALS — BP 101/73 | HR 85 | Temp 98.3°F | Ht 61.0 in | Wt 221.0 lb

## 2018-08-09 DIAGNOSIS — J3089 Other allergic rhinitis: Secondary | ICD-10-CM | POA: Diagnosis not present

## 2018-08-09 DIAGNOSIS — F411 Generalized anxiety disorder: Secondary | ICD-10-CM

## 2018-08-09 DIAGNOSIS — J309 Allergic rhinitis, unspecified: Secondary | ICD-10-CM | POA: Insufficient documentation

## 2018-08-09 DIAGNOSIS — F410 Panic disorder [episodic paroxysmal anxiety] without agoraphobia: Secondary | ICD-10-CM

## 2018-08-09 MED ORDER — MONTELUKAST SODIUM 10 MG PO TABS: 10.0000 mg | ORAL_TABLET | Freq: Every day | ORAL | 3 refills | Status: DC

## 2018-08-09 MED ORDER — ESCITALOPRAM OXALATE 20 MG PO TABS: 20.0000 mg | ORAL_TABLET | Freq: Every day | ORAL | 1 refills | Status: DC

## 2018-08-09 MED ORDER — OLANZAPINE 5 MG PO TBDP: 5.0000 mg | ORAL_TABLET | Freq: Every day | ORAL | 3 refills | Status: DC

## 2018-08-09 MED ORDER — UMECLIDINIUM-VILANTEROL 62.5-25 MCG/INH IN AEPB: 1.0000 | INHALATION_SPRAY | Freq: Every day | RESPIRATORY_TRACT | 6 refills | Status: DC

## 2018-08-09 MED ORDER — NAPROXEN 500 MG PO TABS: 500.0000 mg | ORAL_TABLET | Freq: Two times a day (BID) | ORAL | 3 refills | Status: DC

## 2018-08-09 MED ORDER — CETIRIZINE HCL 10 MG PO TABS: 10.0000 mg | ORAL_TABLET | Freq: Every day | ORAL | 11 refills | Status: DC

## 2018-08-09 NOTE — Assessment & Plan Note (Signed)
Too tired on abilify- will change to olanzepine and see how she does. Continue lexapro and hydroxyzine. Continue to monitor. Call with any concerns.

## 2018-08-09 NOTE — Progress Notes (Signed)
BP 101/73   Pulse 85   Temp 98.3 F (36.8 C) (Oral)   Ht 5\' 1"  (1.549 m)   Wt 221 lb (100.2 kg)   SpO2 98%   BMI 41.76 kg/m    Subjective:    Patient ID: Evelyn Webster, female    DOB: 09/24/1980, 38 y.o.   MRN: [id number]  HPI: Evelyn ShellCrystal Macbride is a 38 y.o. female  Chief Complaint  Patient presents with  . Anxiety  . Allergies    nasal congestion   ANXIETY/STRESS Duration:exacerbated over the last couple of months Anxious mood: yes  Excessive worrying: yes Irritability: yes  Sweating: no Nausea: no Palpitations:yes Hyperventilation: yes Panic attacks: yes Agoraphobia: no  Obscessions/compulsions: yes Depressed mood: no Depression screen [facility] 04/09/2018 02/07/2018 11/19/2017 08/07/2017 07/12/2017  Decreased Interest 0 0 0 0 0  Down, Depressed, Hopeless 0 0 0 0 0  PHQ - 2 Score 0 0 0 0 0  Altered sleeping 0 0 0 1 2  Tired, decreased energy 3 0 1 2 1   Change in appetite 0 0 0 0 0  Feeling bad or failure about yourself  0 0 0 0 0  Trouble concentrating 0 0 0 0 0  Moving slowly or fidgety/restless 0 0 0 0 0  Suicidal thoughts 0 0 0 0 0  PHQ-9 Score 3 0 1 3 3   Difficult doing work/chores - Not difficult at all Somewhat difficult Not difficult at all Not difficult at all   GAD 7 : Generalized Anxiety Score 04/09/2018 02/07/2018 11/19/2017 08/07/2017  Nervous, Anxious, on Edge 2 1 3 3   Control/stop worrying 1 1 1 2   Worry too much - different things 1 1 0 1  Trouble relaxing 3 2 3 3   Restless 2 1 1  0  Easily annoyed or irritable 3 1 2 1   Afraid - awful might happen 0 0 1 0  Total GAD 7 Score 12 7 11 10   Anxiety Difficulty - Somewhat difficult Somewhat difficult Somewhat difficult   Anhedonia: no Weight changes: no Insomnia: yes hard to fall asleep  Hypersomnia: yes Fatigue/loss of energy: yes- abilify makes her very tired and keeps her tired most of the day Feelings of worthlessness: no Feelings of guilt: no Impaired concentration/indecisiveness: no Suicidal  ideations: no  Crying spells: no Recent Stressors/Life Changes: yes   Relationship problems: yes   Family stress: yes     Financial stress: yes    Job stress: yes    Recent death/loss: no  Has been using claritin and flonase over the counter, but her allergies are still bothering her. Stuffy nose, runny nose, itchy eyes. Not feeling great. No other concerns or complaints at this time.   Relevant past medical, surgical, family and social history reviewed and updated as indicated. Interim medical history since our last visit reviewed. Allergies and medications reviewed and updated.  Review of Systems  Constitutional: Negative.   HENT: Positive for congestion, postnasal drip, rhinorrhea and sneezing. Negative for dental problem, drooling, ear discharge, ear pain, facial swelling, hearing loss, mouth sores, nosebleeds, sinus pressure, sinus pain, sore throat, tinnitus, trouble swallowing and voice change.   Eyes: Positive for itching. Negative for photophobia, pain, discharge, redness and visual disturbance.  Respiratory: Negative.   Cardiovascular: Negative.   Gastrointestinal: Negative.   Psychiatric/Behavioral: Negative.     Per HPI unless specifically indicated above     Objective:    BP 101/73   Pulse 85   Temp 98.3 F (36.8 C) (Oral)  Ht 5\' 1"  (1.549 m)   Wt 221 lb (100.2 kg)   SpO2 98%   BMI 41.76 kg/m   Wt Readings from Last 3 Encounters:  08/09/18 221 lb (100.2 kg)  04/15/18 227 lb 3.2 oz (103.1 kg)  04/09/18 227 lb (103 kg)    Physical Exam Vitals signs and nursing note reviewed.  Constitutional:      General: She is not in acute distress.    Appearance: Normal appearance. She is not ill-appearing, toxic-appearing or diaphoretic.  HENT:     Head: Normocephalic and atraumatic.     Right Ear: External ear normal.     Left Ear: External ear normal.     Nose: Nose normal.     Mouth/Throat:     Mouth: Mucous membranes are moist.     Pharynx: Oropharynx is  clear.  Eyes:     General: No scleral icterus.       Right eye: No discharge.        Left eye: No discharge.     Extraocular Movements: Extraocular movements intact.     Conjunctiva/sclera: Conjunctivae normal.     Pupils: Pupils are equal, round, and reactive to light.  Neck:     Musculoskeletal: Normal range of motion and neck supple.  Cardiovascular:     Rate and Rhythm: Normal rate and regular rhythm.     Pulses: Normal pulses.     Heart sounds: Normal heart sounds. No murmur. No friction rub. No gallop.   Pulmonary:     Effort: Pulmonary effort is normal. No respiratory distress.     Breath sounds: Normal breath sounds. No stridor. No wheezing, rhonchi or rales.  Chest:     Chest wall: No tenderness.  Musculoskeletal: Normal range of motion.  Skin:    General: Skin is warm and dry.     Capillary Refill: Capillary refill takes less than 2 seconds.     Coloration: Skin is not jaundiced or pale.     Findings: No bruising, erythema, lesion or rash.  Neurological:     General: No focal deficit present.     Mental Status: She is alert and oriented to person, place, and time. Mental status is at baseline.  Psychiatric:        Mood and Affect: Mood normal.        Behavior: Behavior normal.        Thought Content: Thought content normal.        Judgment: Judgment normal.     Results for orders placed or performed in visit on 04/15/18  Veritor Flu A/B Waived  Result Value Ref Range   Influenza A Negative Negative   Influenza B Negative Negative      Assessment & Plan:   Problem List Items Addressed This Visit      Respiratory   Allergic rhinitis due to allergen    Will start singulair and zyrtec. Recheck 1 month. Call with any concerns.         Other   Generalized anxiety disorder with panic attacks - Primary    Too tired on abilify- will change to olanzepine and see how she does. Continue lexapro and hydroxyzine. Continue to monitor. Call with any concerns.        Relevant Medications   escitalopram (LEXAPRO) 20 MG tablet       Follow up plan: Return 2-4 weeks, for follow up mood.

## 2018-08-09 NOTE — Assessment & Plan Note (Signed)
Will start singulair and zyrtec. Recheck 1 month. Call with any concerns.

## 2018-08-30 ENCOUNTER — Ambulatory Visit: Payer: PRIVATE HEALTH INSURANCE | Admitting: Family Medicine

## 2018-09-03 ENCOUNTER — Ambulatory Visit: Payer: Self-pay | Admitting: Family Medicine

## 2018-10-01 ENCOUNTER — Other Ambulatory Visit: Payer: Self-pay

## 2018-10-01 ENCOUNTER — Ambulatory Visit (INDEPENDENT_AMBULATORY_CARE_PROVIDER_SITE_OTHER): Payer: PRIVATE HEALTH INSURANCE | Admitting: Family Medicine

## 2018-10-01 ENCOUNTER — Encounter: Payer: Self-pay | Admitting: Family Medicine

## 2018-10-01 VITALS — Ht 61.0 in | Wt 230.0 lb

## 2018-10-01 DIAGNOSIS — L309 Dermatitis, unspecified: Secondary | ICD-10-CM | POA: Diagnosis not present

## 2018-10-01 MED ORDER — TRIAMCINOLONE ACETONIDE 0.1 % EX CREA: 1.0000 "application " | TOPICAL_CREAM | Freq: Two times a day (BID) | CUTANEOUS | 3 refills | Status: DC

## 2018-10-01 NOTE — Progress Notes (Signed)
Ht 5\' 1"  (1.549 m)   Wt 230 lb (104.3 kg)   BMI 43.46 kg/m    Subjective:    Patient ID: Evelyn Webster, female    DOB: 26-May-1980, 38 y.o.   MRN: [id number]  HPI: Evelyn Webster is a 38 y.o. female  Chief Complaint  Patient presents with  . Rash    Rash on hands-bilateral, Ongoing 1 month. Skin is peeling and is painful.     . This visit was completed via WebEx due to the restrictions of the COVID-19 pandemic. All issues as above were discussed and addressed. Physical exam was done as above through visual confirmation on WebEx. If it was felt that the patient should be evaluated in the office, they were directed there. The patient verbally consented to this visit. . Location of the patient: home . Location of the provider: home . Those involved with this call:  . Provider: Merrie Roof, PA-C . CMA: Merilyn Baba, [place] . Front Desk/Registration: Jill Side  . Time spent on call: 15 minutes with patient face to face via video conference. More than 50% of this time was spent in counseling and coordination of care. 5 minutes total spent in review of patient's record and preparation of their chart. I verified patient identity using two factors (patient name and date of birth). Patient consents verbally to being seen via telemedicine visit today.   Patient presenting for itchy, painful rash on b/l hands for about a month now. The skin is peeling and raw in places. Denies new lotions, detergents, etc. Has not been trying anything OTC for sxs.   Relevant past medical, surgical, family and social history reviewed and updated as indicated. Interim medical history since our last visit reviewed. Allergies and medications reviewed and updated.  Review of Systems  Per HPI unless specifically indicated above     Objective:    Ht 5\' 1"  (1.549 m)   Wt 230 lb (104.3 kg)   BMI 43.46 kg/m   Wt Readings from Last 3 Encounters:  10/01/18 230 lb (104.3 kg)  08/09/18 221 lb (100.2 kg)   04/15/18 227 lb 3.2 oz (103.1 kg)    Physical Exam Vitals signs and nursing note reviewed.  Constitutional:      General: She is not in acute distress.    Appearance: Normal appearance.  HENT:     Head: Atraumatic.     Right Ear: External ear normal.     Left Ear: External ear normal.     Nose: Nose normal. No congestion.     Mouth/Throat:     Mouth: Mucous membranes are moist.     Pharynx: Oropharynx is clear. No posterior oropharyngeal erythema.  Eyes:     Extraocular Movements: Extraocular movements intact.     Conjunctiva/sclera: Conjunctivae normal.  Neck:     Musculoskeletal: Normal range of motion.  Cardiovascular:     Comments: Unable to assess via virtual visit Pulmonary:     Effort: Pulmonary effort is normal. No respiratory distress.  Musculoskeletal: Normal range of motion.  Skin:    General: Skin is dry.     Findings: Rash (eczema rash with peeling on b/l palms at base of thumbs) present.  Neurological:     Mental Status: She is alert and oriented to person, place, and time.  Psychiatric:        Mood and Affect: Mood normal.        Thought Content: Thought content normal.  Judgment: Judgment normal.     Results for orders placed or performed in visit on 04/15/18  Veritor Flu A/B Waived  Result Value Ref Range   Influenza A Negative Negative   Influenza B Negative Negative      Assessment & Plan:   Problem List Items Addressed This Visit    None    Visit Diagnoses    Hand dermatitis    -  Primary   from eczema. Triamcinolone cream sent, discussed avoiding hot water, scented or irritating products, and use a hypoallergenic cream BID for maintenance       Follow up plan: Return if symptoms worsen or fail to improve.

## 2018-11-15 ENCOUNTER — Encounter: Payer: Self-pay | Admitting: Family Medicine

## 2018-11-15 ENCOUNTER — Other Ambulatory Visit: Payer: Self-pay

## 2018-11-15 ENCOUNTER — Ambulatory Visit (INDEPENDENT_AMBULATORY_CARE_PROVIDER_SITE_OTHER): Payer: PRIVATE HEALTH INSURANCE | Admitting: Family Medicine

## 2018-11-15 VITALS — BP 121/84 | HR 110 | Temp 98.4°F

## 2018-11-15 DIAGNOSIS — L42 Pityriasis rosea: Secondary | ICD-10-CM | POA: Diagnosis not present

## 2018-11-15 DIAGNOSIS — Z23 Encounter for immunization: Secondary | ICD-10-CM | POA: Diagnosis not present

## 2018-11-15 MED ORDER — MONTELUKAST SODIUM 10 MG PO TABS: 10.0000 mg | ORAL_TABLET | Freq: Every day | ORAL | 1 refills | Status: DC

## 2018-11-15 MED ORDER — OLANZAPINE 5 MG PO TBDP: 5.0000 mg | ORAL_TABLET | Freq: Every day | ORAL | 1 refills | Status: DC

## 2018-11-15 MED ORDER — NAPROXEN 500 MG PO TABS: 500.0000 mg | ORAL_TABLET | Freq: Two times a day (BID) | ORAL | 1 refills | Status: DC

## 2018-11-15 NOTE — Progress Notes (Signed)
BP 121/84   Pulse (!) 110   Temp 98.4 F (36.9 C)   SpO2 99%    Subjective:    Patient ID: Evelyn Webster, female    DOB: 1980/07/26, 38 y.o.   MRN: [id number]  HPI: Evelyn Webster is a 38 y.o. female  Chief Complaint  Patient presents with  . Rash   RASH Duration:  Couple of weeks  Location: breasts, stomach, and vulva  Itching: yes Burning: no Redness: yes Oozing: no Scaling: no Blisters: no Painful: no Fevers: no Change in detergents/soaps/personal care products: yes Recent illness: no Recent travel:no History of same: no Context: worse Alleviating factors: nothing Treatments attempted:hydrocortisone cream and lotion/moisturizer Shortness of breath: no  Throat/tongue swelling: no Myalgias/arthralgias: no  Relevant past medical, surgical, family and social history reviewed and updated as indicated. Interim medical history since our last visit reviewed. Allergies and medications reviewed and updated.  Review of Systems  Constitutional: Negative.   Respiratory: Negative.   Cardiovascular: Negative.   Skin: Positive for rash. Negative for color change, pallor and wound.  Psychiatric/Behavioral: Negative.     Per HPI unless specifically indicated above     Objective:    BP 121/84   Pulse (!) 110   Temp 98.4 F (36.9 C)   SpO2 99%   Wt Readings from Last 3 Encounters:  10/01/18 230 lb (104.3 kg)  08/09/18 221 lb (100.2 kg)  04/15/18 227 lb 3.2 oz (103.1 kg)    Physical Exam Vitals signs and nursing note reviewed.  Constitutional:      General: She is not in acute distress.    Appearance: Normal appearance. She is not ill-appearing, toxic-appearing or diaphoretic.  HENT:     Head: Normocephalic and atraumatic.     Right Ear: External ear normal.     Left Ear: External ear normal.     Nose: Nose normal.     Mouth/Throat:     Mouth: Mucous membranes are moist.     Pharynx: Oropharynx is clear.  Eyes:     General: No scleral icterus.     Right eye: No discharge.        Left eye: No discharge.     Extraocular Movements: Extraocular movements intact.     Conjunctiva/sclera: Conjunctivae normal.     Pupils: Pupils are equal, round, and reactive to light.  Neck:     Musculoskeletal: Normal range of motion and neck supple.  Cardiovascular:     Rate and Rhythm: Normal rate and regular rhythm.     Pulses: Normal pulses.     Heart sounds: Normal heart sounds. No murmur. No friction rub. No gallop.   Pulmonary:     Effort: Pulmonary effort is normal. No respiratory distress.     Breath sounds: Normal breath sounds. No stridor. No wheezing, rhonchi or rales.  Chest:     Chest wall: No tenderness.  Musculoskeletal: Normal range of motion.  Skin:    General: Skin is warm and dry.     Capillary Refill: Capillary refill takes less than 2 seconds.     Coloration: Skin is not jaundiced or pale.     Findings: Rash (patches of erythematous skin with clearing centers on chest with hearld patch on L breast) present. No bruising, erythema or lesion.  Neurological:     General: No focal deficit present.     Mental Status: She is alert and oriented to person, place, and time. Mental status is at baseline.  Psychiatric:  Mood and Affect: Mood normal.        Behavior: Behavior normal.        Thought Content: Thought content normal.        Judgment: Judgment normal.     Results for orders placed or performed in visit on 04/15/18  Veritor Flu A/B Waived  Result Value Ref Range   Influenza A Negative Negative   Influenza B Negative Negative      Assessment & Plan:   Problem List Items Addressed This Visit    None    Visit Diagnoses    Pityriasis rosea    -  Primary   Reassured patient. Call if not improving. Continue to monitor.   Immunization due       Flu shot given today.   Relevant Orders   Flu Vaccine QUAD 6+ mos PF IM (Fluarix Quad PF) (Completed)       Follow up plan: Return December, for Physical.

## 2018-11-15 NOTE — Patient Instructions (Signed)
Pityriasis Rosea Pityriasis rosea is a rash that usually appears on the chest, abdomen, and back. It may also appear on the upper arms and upper legs. It usually begins as a single patch, and then more patches start to develop. The rash may cause mild itching, but it normally does not cause other problems. It usually goes away without treatment. However, it may take weeks or months for the rash to go away completely. What are the causes? The cause of this condition is not known. The condition does not spread from person to person (is not contagious). What increases the risk? This condition is more likely to develop in:  Persons aged 10-35 years.  Pregnant women. It is more common in the spring and fall seasons. What are the signs or symptoms? The main symptom of this condition is a rash.  The rash usually begins with a single oval patch that is larger than the ones that follow. This is called a herald patch. It generally appears a week or more before the rest of the rash appears.  When more patches start to develop, they spread quickly on the chest, abdomen, back, arms, and legs. These patches are smaller than the first one.  The patches that make up the rash are usually oval-shaped and pink or red in color. They are usually flat but may sometimes be raised so that they can be felt with a finger. They may also be finely crinkled and have a scaly ring around the edge. Some people may have mild itching and nonspecific symptoms, such as:  Nausea.  Loss of appetite.  Difficulty concentrating.  Headache.  Irritability.  Sore throat.  Mild fever. How is this diagnosed? This condition may be diagnosed based on:  Your medical history and a physical exam.  Tests to rule out other causes. This may include blood tests or a test in which a small sample of skin is removed from the rash (biopsy) and checked in a lab. How is this treated?     Treatment is not usually needed for this  condition. The rash will often go away on its own in 4-8 weeks. In some cases, a health care provider may recommend or prescribe medicine to reduce itching. Follow these instructions at home:  Take or apply over-the-counter and prescription medicines only as told by your health care provider.  Avoid scratching the affected areas of skin.  Do not take hot baths or use a sauna. Use only warm water when bathing or showering. Heat can increase itching. Adding cornstarch to your bath may help to relieve the itching.  Avoid exposure to the sun and other sources of UV light, such as tanning beds, as told by your health care provider. UV light may help the rash go away but may cause unwanted changes in skin color.  Keep all follow-up visits as told by your health care provider. This is important. Contact a health care provider if:  Your rash does not go away in 8 weeks.  Your rash gets much worse.  You have a fever.  You have swelling or pain in the rash area.  You have fluid, blood, or pus coming from the rash area. Summary  Pityriasis rosea is a rash that usually appears on the trunk of the body. It can also appear on the upper arms and upper legs.  The rash usually begins with a single oval patch (herald patch) that appears a week or more before the rest of the rash appears.   The herald patch is larger than the ones that follow.  The rash may cause mild itching, but it usually does not cause other problems. It usually goes away without treatment in 4-8 weeks.  In some cases, a health care provider may recommend or prescribe medicine to reduce itching. This information is not intended to replace advice given to you by your health care provider. Make sure you discuss any questions you have with your health care provider. Document Released: 03/29/2001 Document Revised: 02/19/2017 Document Reviewed: 02/19/2017 Elsevier Patient Education  2020 [employer]  

## 2019-02-14 ENCOUNTER — Ambulatory Visit (INDEPENDENT_AMBULATORY_CARE_PROVIDER_SITE_OTHER): Payer: PRIVATE HEALTH INSURANCE | Admitting: Nurse Practitioner

## 2019-02-14 ENCOUNTER — Encounter: Payer: Self-pay | Admitting: Nurse Practitioner

## 2019-02-14 ENCOUNTER — Other Ambulatory Visit: Payer: Self-pay

## 2019-02-14 VITALS — BP 106/71 | HR 94 | Temp 98.1°F

## 2019-02-14 DIAGNOSIS — M778 Other enthesopathies, not elsewhere classified: Secondary | ICD-10-CM | POA: Diagnosis not present

## 2019-02-14 MED ORDER — PREDNISONE 10 MG PO TABS: ORAL_TABLET | ORAL | 0 refills | Status: DC

## 2019-02-14 NOTE — Assessment & Plan Note (Signed)
Along flexural aspect.  Recommend placing ice to area every 2 hours for 20 minutes and resting, use wrist support brace or ACE wrap if at work.  Prednisone taper sent.  No trauma, imaging deferred at this time . May continue Tylenol as needed at home and use Biofreeze.  Return to office for worsening or continued issues.

## 2019-02-14 NOTE — Progress Notes (Signed)
BP 106/71   Pulse 94   Temp 98.1 F (36.7 C) (Oral)   SpO2 93%    Subjective:    Patient ID: Evelyn Webster, female    DOB: [birth year], 38 y.o.   MRN: [id number]  HPI: Evelyn Webster is a 38 y.o. female  Chief Complaint  Patient presents with  . Wrist Pain    pt states her left wrist started hurting on Wednesday, states she did not remember hurting it or doing anything to it    WRIST PAIN (LEFT) Started hurting on Wednesday, hurts when flexes down and grabs cup.  Lifted a box prior to this pain, but was not that heavy.  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  Is right hand dominant.  No recent falls or injuries.  Reports taking Prednisone before without issue. Duration: days Involved wrist: left Mechanism of injury:  unknown Location: diffuse Onset: gradual Severity: 10/10 at worst, 6/10 least Quality:  sharp, dull and aching Frequency: constant Radiation: yes into fingers Aggravating factors: gripping  Alleviating factors: has taken Tylenol with no relief Status: fluctuating Treatments attempted: APAP    Relief with NSAIDs?:  No NSAIDs Taken Weakness: yes Numbness: yes diffuse Redness: no Bruising: no Swelling: no Fevers: no  Relevant past medical, surgical, family and social history reviewed and updated as indicated. Interim medical history since our last visit reviewed. Allergies and medications reviewed and updated.  Review of Systems  Constitutional: Negative for activity change, appetite change, diaphoresis, fatigue and fever.  Respiratory: Negative for cough, chest tightness and shortness of breath.   Cardiovascular: Negative.   Musculoskeletal: Positive for arthralgias.  Neurological: Negative.   Psychiatric/Behavioral: Negative.     Per HPI unless specifically indicated above     Objective:    BP 106/71   Pulse 94   Temp 98.1 F (36.7 C) (Oral)   SpO2 93%   Wt Readings from Last 3  Encounters:  10/01/18 230 lb (104.3 kg)  08/09/18 221 lb (100.2 kg)  04/15/18 227 lb 3.2 oz (103.1 kg)    Physical Exam Vitals and nursing note reviewed.  Constitutional:      General: She is awake. She is not in acute distress.    Appearance: She is well-developed. She is not ill-appearing.  HENT:     Head: Normocephalic.     Right Ear: Hearing normal.     Left Ear: Hearing normal.  Eyes:     General: Lids are normal.        Right eye: No discharge.        Left eye: No discharge.     Conjunctiva/sclera: Conjunctivae normal.     Pupils: Pupils are equal, round, and reactive to light.  Cardiovascular:     Rate and Rhythm: Normal rate and regular rhythm.     Heart sounds: Normal heart sounds. No murmur. No gallop.   Pulmonary:     Effort: Pulmonary effort is normal. No accessory muscle usage or respiratory distress.     Breath sounds: Normal breath sounds.  Abdominal:     General: Bowel sounds are normal.     Palpations: Abdomen is soft.  Musculoskeletal:     Right wrist: Normal.     Left wrist: Tenderness present. No swelling, lacerations or crepitus. Decreased range of motion. Normal pulse.     Cervical back: Normal range of motion and neck supple.     Right lower  leg: No edema.     Left lower leg: No edema.     Comments: Left wrist: with discomfort on flexion and ulnar and radial deviation + supination/pronation.  Normal extension.  Numbness with Phalen test.  Mild tenderness along flexor tendon.  Equal grips.  Pulses normal.  No rashes, abrasions, bruising.  Skin:    General: Skin is warm and dry.  Neurological:     Mental Status: She is alert and oriented to person, place, and time.  Psychiatric:        Attention and Perception: Attention normal.        Mood and Affect: Mood normal.        Behavior: Behavior normal. Behavior is cooperative.        Thought Content: Thought content normal.        Judgment: Judgment normal.     Results for orders placed or performed  in visit on 04/15/18  Veritor Flu A/B Waived  Result Value Ref Range   Influenza A Negative Negative   Influenza B Negative Negative      Assessment & Plan:   Problem List Items Addressed This Visit      Musculoskeletal and Integument   Left wrist tendinitis - Primary    Along flexural aspect.  Recommend placing ice to area every 2 hours for 20 minutes and resting, use wrist support brace or ACE wrap if at work.  Prednisone taper sent.  No trauma, imaging deferred at this time . May continue Tylenol as needed at home and use Biofreeze.  Return to office for worsening or continued issues.          Follow up plan: Return if symptoms worsen or fail to improve.

## 2019-02-14 NOTE — Patient Instructions (Signed)
Wrist Pain, Adult There are many things that can cause wrist pain. Some common causes include:  An injury to the wrist area.  Overuse of the joint.  A condition that causes too much pressure to be put on a nerve in the wrist (carpal tunnel syndrome).  Wear and tear of the joints that happens as a person gets older (osteoarthritis).  Other types of arthritis. Sometimes, the cause of wrist pain is not known. Often, the pain goes away when you follow your doctor's instructions for helping pain at home, such as resting or icing your wrist. If your wrist pain does not go away, it is important to tell your doctor. Follow these instructions at home:  Rest the wrist area for 48 hours or more, or as long as told by your doctor.  If a splint or elastic bandage has been put on your wrist, use it as told by your doctor. ? Take off the splint or bandage only as told by your doctor. ? Loosen the splint or bandage if your fingers tingle, lose feeling (get numb), or turn cold or blue.  If directed, apply ice to the injured area: ? If you have a removable splint or elastic bandage, remove it as told by your doctor. ? Put ice in a plastic bag. ? Place a towel between your skin and the bag or between your splint or bandage and the bag. ? Leave the ice on for 20 minutes, 2-3 times a day.   Keep your arm raised (elevated) above the level of your heart while you are sitting or lying down.  Take over-the-counter and prescription medicines only as told by your doctor.  Keep all follow-up visits as told by your doctor. This is important. Contact a doctor if:  You have a sudden sharp pain in the wrist, hand, or arm that is different or new.  The swelling or bruising on your wrist or hand gets worse.  Your skin becomes red, gets a rash, or has open sores.  Your pain does not get better or it gets worse. Get help right away if:  You lose feeling in your fingers or hand.  Your fingers turn white,  very red, or cold and blue.  You cannot move your fingers.  You have a fever or chills. This information is not intended to replace advice given to you by your health care provider. Make sure you discuss any questions you have with your health care provider. Document Released: 08/09/2007 Document Revised: 02/02/2017 Document Reviewed: 09/09/2015 Elsevier Patient Education  2020 Elsevier Inc.  

## 2019-03-03 ENCOUNTER — Encounter: Payer: PRIVATE HEALTH INSURANCE | Admitting: Family Medicine

## 2019-03-19 ENCOUNTER — Other Ambulatory Visit: Payer: Self-pay

## 2019-03-20 ENCOUNTER — Encounter: Payer: PRIVATE HEALTH INSURANCE | Admitting: Family Medicine

## 2019-03-24 ENCOUNTER — Encounter: Payer: PRIVATE HEALTH INSURANCE | Admitting: Family Medicine
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
## 2019-03-26 ENCOUNTER — Telehealth (INDEPENDENT_AMBULATORY_CARE_PROVIDER_SITE_OTHER): Payer: Self-pay

## 2019-03-27 NOTE — Telephone Encounter (Signed)
I would advise her to reach out to her PCP for workup.  Abdominal pain can be a number of issues and their PCP is better suited to assess and determine what tests or specialists might be needed.

## 2019-03-27 NOTE — Telephone Encounter (Signed)
Patient has been aware with medical advice and verbalized understanding 

## 2019-03-27 NOTE — Telephone Encounter (Signed)
I left a message on the patient voicemail to return a call to the office 

## 2019-04-21 ENCOUNTER — Ambulatory Visit (INDEPENDENT_AMBULATORY_CARE_PROVIDER_SITE_OTHER): Payer: PRIVATE HEALTH INSURANCE | Admitting: Family Medicine

## 2019-04-21 ENCOUNTER — Other Ambulatory Visit: Payer: Self-pay

## 2019-04-21 ENCOUNTER — Encounter: Payer: PRIVATE HEALTH INSURANCE | Admitting: Family Medicine

## 2019-04-21 ENCOUNTER — Encounter: Payer: Self-pay | Admitting: Family Medicine

## 2019-04-21 ENCOUNTER — Other Ambulatory Visit (HOSPITAL_COMMUNITY)
Admission: RE | Admit: 2019-04-21 | Discharge: 2019-04-21 | Disposition: A | Discharge status: Home / Self Care | Payer: Medicaid Other | Source: Ambulatory Visit | Attending: Family Medicine | Admitting: Family Medicine

## 2019-04-21 VITALS — BP 115/83 | HR 89 | Temp 97.5°F | Ht 61.77 in | Wt 218.4 lb

## 2019-04-21 DIAGNOSIS — K219 Gastro-esophageal reflux disease without esophagitis: Secondary | ICD-10-CM

## 2019-04-21 DIAGNOSIS — Z23 Encounter for immunization: Secondary | ICD-10-CM

## 2019-04-21 DIAGNOSIS — Z720 Tobacco use: Secondary | ICD-10-CM | POA: Diagnosis not present

## 2019-04-21 DIAGNOSIS — Z13220 Encounter for screening for lipoid disorders: Secondary | ICD-10-CM

## 2019-04-21 DIAGNOSIS — F411 Generalized anxiety disorder: Secondary | ICD-10-CM

## 2019-04-21 DIAGNOSIS — Z0000 Encounter for general adult medical examination without abnormal findings: Secondary | ICD-10-CM | POA: Diagnosis not present

## 2019-04-21 DIAGNOSIS — J449 Chronic obstructive pulmonary disease, unspecified: Secondary | ICD-10-CM | POA: Diagnosis not present

## 2019-04-21 DIAGNOSIS — F410 Panic disorder [episodic paroxysmal anxiety] without agoraphobia: Secondary | ICD-10-CM

## 2019-04-21 DIAGNOSIS — Z79899 Other long term (current) drug therapy: Secondary | ICD-10-CM | POA: Diagnosis not present

## 2019-04-21 DIAGNOSIS — Z124 Encounter for screening for malignant neoplasm of cervix: Secondary | ICD-10-CM | POA: Insufficient documentation

## 2019-04-21 DIAGNOSIS — J3089 Other allergic rhinitis: Secondary | ICD-10-CM

## 2019-04-21 MED ORDER — MONTELUKAST SODIUM 10 MG PO TABS: 10.0000 mg | ORAL_TABLET | Freq: Every day | ORAL | 1 refills | Status: DC

## 2019-04-21 MED ORDER — ALBUTEROL SULFATE HFA 108 (90 BASE) MCG/ACT IN AERS: 2.0000 | INHALATION_SPRAY | Freq: Four times a day (QID) | RESPIRATORY_TRACT | 6 refills | Status: DC | PRN

## 2019-04-21 MED ORDER — OMEPRAZOLE 40 MG PO CPDR: 40.0000 mg | DELAYED_RELEASE_CAPSULE | Freq: Every day | ORAL | 3 refills | Status: DC

## 2019-04-21 MED ORDER — OLANZAPINE 5 MG PO TBDP: 5.0000 mg | ORAL_TABLET | Freq: Every day | ORAL | 1 refills | Status: DC

## 2019-04-21 MED ORDER — ESCITALOPRAM OXALATE 20 MG PO TABS: 20.0000 mg | ORAL_TABLET | Freq: Every day | ORAL | 1 refills | Status: DC

## 2019-04-21 MED ORDER — UMECLIDINIUM-VILANTEROL 62.5-25 MCG/INH IN AEPB: 1.0000 | INHALATION_SPRAY | Freq: Every day | RESPIRATORY_TRACT | 6 refills | Status: DC

## 2019-04-21 MED ORDER — NAPROXEN 500 MG PO TABS: 500.0000 mg | ORAL_TABLET | Freq: Two times a day (BID) | ORAL | 1 refills | Status: DC

## 2019-04-21 NOTE — Assessment & Plan Note (Signed)
Under good control on current regimen. Continue current regimen. Continue to monitor. Call with any concerns. Refills given. Labs drawn today.   

## 2019-04-21 NOTE — Patient Instructions (Signed)
Health Maintenance, Female Adopting a healthy lifestyle and getting preventive care are important in promoting health and wellness. Ask your health care provider about:  The right schedule for you to have regular tests and exams.  Things you can do on your own to prevent diseases and keep yourself healthy. What should I know about diet, weight, and exercise? Eat a healthy diet   Eat a diet that includes plenty of vegetables, fruits, low-fat dairy products, and lean protein.  Do not eat a lot of foods that are high in solid fats, added sugars, or sodium. Maintain a healthy weight Body mass index (BMI) is used to identify weight problems. It estimates body fat based on height and weight. Your health care provider can help determine your BMI and help you achieve or maintain a healthy weight. Get regular exercise Get regular exercise. This is one of the most important things you can do for your health. Most adults should:  Exercise for at least 150 minutes each week. The exercise should increase your heart rate and make you sweat (moderate-intensity exercise).  Do strengthening exercises at least twice a week. This is in addition to the moderate-intensity exercise.  Spend less time sitting. Even light physical activity can be beneficial. Watch cholesterol and blood lipids Have your blood tested for lipids and cholesterol at 39 years of age, then have this test every 5 years. Have your cholesterol levels checked more often if:  Your lipid or cholesterol levels are high.  You are older than 40 years of age.  You are at high risk for heart disease. What should I know about cancer screening? Depending on your health history and family history, you may need to have cancer screening at various ages. This may include screening for:  Breast cancer.  Cervical cancer.  Colorectal cancer.  Skin cancer.  Lung cancer. What should I know about heart disease, diabetes, and high blood  pressure? Blood pressure and heart disease  High blood pressure causes heart disease and increases the risk of stroke. This is more likely to develop in people who have high blood pressure readings, are of African descent, or are overweight.  Have your blood pressure checked: ? Every 3-5 years if you are 18-39 years of age. ? Every year if you are 40 years old or older. Diabetes Have regular diabetes screenings. This checks your fasting blood sugar level. Have the screening done:  Once every three years after age 40 if you are at a normal weight and have a low risk for diabetes.  More often and at a younger age if you are overweight or have a high risk for diabetes. What should I know about preventing infection? Hepatitis B If you have a higher risk for hepatitis B, you should be screened for this virus. Talk with your health care provider to find out if you are at risk for hepatitis B infection. Hepatitis C Testing is recommended for:  Everyone born from 1945 through 1965.  Anyone with known risk factors for hepatitis C. Sexually transmitted infections (STIs)  Get screened for STIs, including gonorrhea and chlamydia, if: ? You are sexually active and are younger than 39 years of age. ? You are older than 39 years of age and your health care provider tells you that you are at risk for this type of infection. ? Your sexual activity has changed since you were last screened, and you are at increased risk for chlamydia or gonorrhea. Ask your health care provider if   you are at risk.  Ask your health care provider about whether you are at high risk for HIV. Your health care provider may recommend a prescription medicine to help prevent HIV infection. If you choose to take medicine to prevent HIV, you should first get tested for HIV. You should then be tested every 3 months for as long as you are taking the medicine. Pregnancy  If you are about to stop having your period (premenopausal) and  you may become pregnant, seek counseling before you get pregnant.  Take 400 to 800 micrograms (mcg) of folic acid every day if you become pregnant.  Ask for birth control (contraception) if you want to prevent pregnancy. Osteoporosis and menopause Osteoporosis is a disease in which the bones lose minerals and strength with aging. This can result in bone fractures. If you are 65 years old or older, or if you are at risk for osteoporosis and fractures, ask your health care provider if you should:  Be screened for bone loss.  Take a calcium or vitamin D supplement to lower your risk of fractures.  Be given hormone replacement therapy (HRT) to treat symptoms of menopause. Follow these instructions at home: Lifestyle  Do not use any products that contain nicotine or tobacco, such as cigarettes, e-cigarettes, and chewing tobacco. If you need help quitting, ask your health care provider.  Do not use street drugs.  Do not share needles.  Ask your health care provider for help if you need support or information about quitting drugs. Alcohol use  Do not drink alcohol if: ? Your health care provider tells you not to drink. ? You are pregnant, may be pregnant, or are planning to become pregnant.  If you drink alcohol: ? Limit how much you use to 0-1 drink a day. ? Limit intake if you are breastfeeding.  Be aware of how much alcohol is in your drink. In the [place] one drink equals one 12 oz bottle of beer (355 mL), one 5 oz glass of wine (148 mL), or one 1 oz glass of hard liquor (44 mL). General instructions  Schedule regular health, dental, and eye exams.  Stay current with your vaccines.  Tell your health care provider if: ? You often feel depressed. ? You have ever been abused or do not feel safe at home. Summary  Adopting a healthy lifestyle and getting preventive care are important in promoting health and wellness.  Follow your health care provider's instructions about healthy  diet, exercising, and getting tested or screened for diseases.  Follow your health care provider's instructions on monitoring your cholesterol and blood pressure. This information is not intended to replace advice given to you by your health care provider. Make sure you discuss any questions you have with your health care provider. Document Revised: 02/13/2018 Document Reviewed: 02/13/2018 Elsevier Patient Education  2020 Elsevier Inc. Pneumococcal Polysaccharide Vaccine (PPSV23): What You Need to Know 1. Why get vaccinated? Pneumococcal polysaccharide vaccine (PPSV23) can prevent pneumococcal disease. Pneumococcal disease refers to any illness caused by pneumococcal bacteria. These bacteria can cause many types of illnesses, including pneumonia, which is an infection of the lungs. Pneumococcal bacteria are one of the most common causes of pneumonia. Besides pneumonia, pneumococcal bacteria can also cause:  Ear infections  Sinus infections  Meningitis (infection of the tissue covering the brain and spinal cord)  Bacteremia (bloodstream infection) Anyone can get pneumococcal disease, but children under 2 years of age, people with certain medical conditions, adults 65 years or   older, and cigarette smokers are at the highest risk. Most pneumococcal infections are mild. However, some can result in long-term problems, such as brain damage or hearing loss. Meningitis, bacteremia, and pneumonia caused by pneumococcal disease can be fatal. 2. PPSV23 PPSV23 protects against 23 types of bacteria that cause pneumococcal disease. PPSV23 is recommended for:  All adults 65 years or older,  Anyone 2 years or older with certain medical conditions that can lead to an increased risk for pneumococcal disease. Most people need only one dose of PPSV23. A second dose of PPSV23, and another type of pneumococcal vaccine called PCV13, are recommended for certain high-risk groups. Your health care provider can  give you more information. People 65 years or older should get a dose of PPSV23 even if they have already gotten one or more doses of the vaccine before they turned 65. 3. Talk with your health care provider Tell your vaccine provider if the person getting the vaccine:  Has had an allergic reaction after a previous dose of PPSV23, or has any severe, life-threatening allergies. In some cases, your health care provider may decide to postpone PPSV23 vaccination to a future visit. People with minor illnesses, such as a cold, may be vaccinated. People who are moderately or severely ill should usually wait until they recover before getting PPSV23. Your health care provider can give you more information. 4. Risks of a vaccine reaction  Redness or pain where the shot is given, feeling tired, fever, or muscle aches can happen after PPSV23. People sometimes faint after medical procedures, including vaccination. Tell your provider if you feel dizzy or have vision changes or ringing in the ears. As with any medicine, there is a very remote chance of a vaccine causing a severe allergic reaction, other serious injury, or death. 5. What if there is a serious problem? An allergic reaction could occur after the vaccinated person leaves the clinic. If you see signs of a severe allergic reaction (hives, swelling of the face and throat, difficulty breathing, a fast heartbeat, dizziness, or weakness), call 9-1-1 and get the person to the nearest hospital. For other signs that concern you, call your health care provider. Adverse reactions should be reported to the Vaccine Adverse Event Reporting System (VAERS). Your health care provider will usually file this report, or you can do it yourself. Visit the VAERS website at www.vaers.hhs.gov or call 1-800-822-7967. VAERS is only for reporting reactions, and VAERS staff do not give medical advice. 6. How can I learn more?  Ask your health care provider.  Call your local  or state health department.  Contact the Centers for Disease Control and Prevention (CDC): ? Call 1-800-232-4636 (1-800-CDC-INFO) or ? Visit CDC's website at www.cdc.gov/vaccines CDC Vaccine Information Statement PPSV23 Vaccine (01/02/2018) This information is not intended to replace advice given to you by your health care provider. Make sure you discuss any questions you have with your health care provider. Document Revised: 06/11/2018 Document Reviewed: 10/02/2017 Elsevier Patient Education  2020 Elsevier Inc.  
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
## 2019-04-22 LAB — COMPREHENSIVE METABOLIC PANEL
ALT: 10 IU/L (ref 0–32)
AST: 10 IU/L (ref 0–40)
Albumin/Globulin Ratio: 1.4 (ref 1.2–2.2)
Albumin: 3.7 g/dL — ABNORMAL LOW (ref 3.8–4.8)
Alkaline Phosphatase: 99 IU/L (ref 39–117)
BUN/Creatinine Ratio: 13 (ref 9–23)
BUN: 10 mg/dL (ref 6–20)
Bilirubin Total: 0.4 mg/dL (ref 0.0–1.2)
CO2: 25 mmol/L (ref 20–29)
Calcium: 8.7 mg/dL (ref 8.7–10.2)
Chloride: 104 mmol/L (ref 96–106)
Creatinine, Ser: 0.75 mg/dL (ref 0.57–1.00)
GFR calc Af Amer: 116 mL/min/{1.73_m2} (ref >59)
GFR calc non Af Amer: 101 mL/min/{1.73_m2} (ref >59)
Globulin, Total: 2.7 g/dL (ref 1.5–4.5)
Glucose: 102 mg/dL — ABNORMAL HIGH (ref 65–99)
Potassium: 4 mmol/L (ref 3.5–5.2)
Sodium: 139 mmol/L (ref 134–144)
Total Protein: 6.4 g/dL (ref 6.0–8.5)

## 2019-04-22 LAB — CBC WITH DIFFERENTIAL/PLATELET
Basophils Absolute: 0 10*3/uL (ref 0.0–0.2)
Basos: 0 %
EOS (ABSOLUTE): 0.2 10*3/uL (ref 0.0–0.4)
Eos: 2 %
Hematocrit: 39.3 % (ref 34.0–46.6)
Hemoglobin: 12.8 g/dL (ref 11.1–15.9)
Immature Grans (Abs): 0 10*3/uL (ref 0.0–0.1)
Immature Granulocytes: 0 %
Lymphocytes Absolute: 3.2 10*3/uL — ABNORMAL HIGH (ref 0.7–3.1)
Lymphs: 33 %
MCH: 28.4 pg (ref 26.6–33.0)
MCHC: 32.6 g/dL (ref 31.5–35.7)
MCV: 87 fL (ref 79–97)
Monocytes Absolute: 0.7 10*3/uL (ref 0.1–0.9)
Monocytes: 7 %
Neutrophils Absolute: 5.5 10*3/uL (ref 1.4–7.0)
Neutrophils: 58 %
Platelets: 311 10*3/uL (ref 150–450)
RBC: 4.51 x10E6/uL (ref 3.77–5.28)
RDW: 13.2 % (ref 11.7–15.4)
WBC: 9.6 10*3/uL (ref 3.4–10.8)

## 2019-04-22 LAB — LIPID PANEL W/O CHOL/HDL RATIO
Cholesterol, Total: 151 mg/dL (ref 100–199)
HDL: 35 mg/dL — ABNORMAL LOW (ref >39)
LDL Chol Calc (NIH): 97 mg/dL (ref 0–99)
Triglycerides: 103 mg/dL (ref 0–149)
VLDL Cholesterol Cal: 19 mg/dL (ref 5–40)

## 2019-04-22 LAB — TSH: TSH: 2.39 u[IU]/mL (ref 0.450–4.500)

## 2019-04-23 LAB — MICROSCOPIC EXAMINATION: RBC, Urine: >30 /hpf — AB (ref 0–2)

## 2019-04-23 LAB — CYTOLOGY - PAP
Comment: NEGATIVE
Diagnosis: NEGATIVE
High risk HPV: NEGATIVE

## 2019-04-23 LAB — UA/M W/RFLX CULTURE, ROUTINE
Bilirubin, UA: NEGATIVE
Ketones, UA: NEGATIVE
Nitrite, UA: NEGATIVE
Protein,UA: NEGATIVE
RBC, UA: NEGATIVE
Specific Gravity, UA: >1.03 — ABNORMAL HIGH (ref 1.005–1.030)
Urobilinogen, Ur: 0.2 mg/dL (ref 0.2–1.0)
pH, UA: 5.5 (ref 5.0–7.5)

## 2019-04-23 LAB — URINE CULTURE, REFLEX

## 2019-04-23 LAB — BAYER DCA HB A1C WAIVED: HB A1C (BAYER DCA - WAIVED): 6.1 % (ref <7.0)

## 2019-04-25 ENCOUNTER — Encounter: Payer: Self-pay | Admitting: Family Medicine

## 2019-05-30 ENCOUNTER — Ambulatory Visit (INDEPENDENT_AMBULATORY_CARE_PROVIDER_SITE_OTHER): Payer: PRIVATE HEALTH INSURANCE | Admitting: Nurse Practitioner

## 2019-05-30 ENCOUNTER — Encounter: Payer: Self-pay | Admitting: Nurse Practitioner

## 2019-05-30 ENCOUNTER — Other Ambulatory Visit: Payer: Self-pay

## 2019-05-30 VITALS — BP 120/80 | HR 79 | Temp 97.7°F | Ht 61.0 in | Wt 223.0 lb

## 2019-05-30 DIAGNOSIS — R109 Unspecified abdominal pain: Secondary | ICD-10-CM | POA: Diagnosis not present

## 2019-05-30 LAB — UA/M W/RFLX CULTURE, ROUTINE
Bilirubin, UA: NEGATIVE
Glucose, UA: NEGATIVE
Ketones, UA: NEGATIVE
Leukocytes,UA: NEGATIVE
Nitrite, UA: NEGATIVE
Protein,UA: NEGATIVE
Specific Gravity, UA: 1.02 (ref 1.005–1.030)
Urobilinogen, Ur: 0.2 mg/dL (ref 0.2–1.0)
pH, UA: 5.5 (ref 5.0–7.5)

## 2019-05-30 LAB — MICROSCOPIC EXAMINATION
Bacteria, UA: NONE SEEN
WBC, UA: NONE SEEN /hpf (ref 0–5)
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
## 2019-05-30 NOTE — Assessment & Plan Note (Addendum)
Acute, ongoing.  No red flags in history or on examination; no fevers, nausea/vomiting, changes in BM etc.  UA in office stable - showed trace blood, RBC, and epithelial cells, likely due to recent menstration.  Given tender to palpation near rib cage, likely MSK related, advised to treat with rest, ice/heat, and muscle relaxer prn qhs; abdominal stretches handout given.  Patient denied needing refill of muscle relaxer medication.  Advised of strict return precautions, if any new symptoms developed such a fever, nausea vomiting, bloating, change in BM, to return to clinic for evaluation.

## 2019-05-30 NOTE — Patient Instructions (Addendum)

## 2019-06-02 ENCOUNTER — Ambulatory Visit (INDEPENDENT_AMBULATORY_CARE_PROVIDER_SITE_OTHER): Payer: PRIVATE HEALTH INSURANCE | Admitting: Family Medicine

## 2019-06-02 ENCOUNTER — Encounter: Payer: Self-pay | Admitting: Family Medicine

## 2019-06-02 ENCOUNTER — Other Ambulatory Visit: Payer: Self-pay

## 2019-06-02 VITALS — BP 122/87 | HR 80 | Temp 98.1°F

## 2019-06-02 DIAGNOSIS — R109 Unspecified abdominal pain: Secondary | ICD-10-CM | POA: Diagnosis not present

## 2019-06-02 LAB — UA/M W/RFLX CULTURE, ROUTINE
Bilirubin, UA: NEGATIVE
Glucose, UA: NEGATIVE
Ketones, UA: NEGATIVE
Leukocytes,UA: NEGATIVE
Nitrite, UA: NEGATIVE
Protein,UA: NEGATIVE
RBC, UA: NEGATIVE
Specific Gravity, UA: 1.025 (ref 1.005–1.030)
Urobilinogen, Ur: 1 mg/dL (ref 0.2–1.0)
pH, UA: 7 (ref 5.0–7.5)

## 2019-06-02 LAB — CBC WITH DIFFERENTIAL/PLATELET
Hematocrit: 38.8 % (ref 34.0–46.6)
Hemoglobin: 13.1 g/dL (ref 11.1–15.9)
Lymphocytes Absolute: 3 10*3/uL (ref 0.7–3.1)
Lymphs: 32 %
MCH: 30 pg (ref 26.6–33.0)
MCHC: 33.8 g/dL (ref 31.5–35.7)
MCV: 89 fL (ref 79–97)
MID (Absolute): 0.8 10*3/uL (ref 0.1–1.6)
MID: 9 %
Neutrophils Absolute: 5.6 10*3/uL (ref 1.4–7.0)
Neutrophils: 59 %
Platelets: 288 10*3/uL (ref 150–450)
RBC: 4.37 x10E6/uL (ref 3.77–5.28)
RDW: 14.5 % (ref 11.7–15.4)
WBC: 9.4 10*3/uL (ref 3.4–10.8)

## 2019-06-02 MED ORDER — KETOROLAC TROMETHAMINE 60 MG/2ML IM SOLN
60.0000 mg | Freq: Once | INTRAMUSCULAR | Status: AC
  Administered 2019-06-02: 60 mg via INTRAMUSCULAR

## 2019-06-02 MED ORDER — CYCLOBENZAPRINE HCL 10 MG PO TABS: 10.0000 mg | ORAL_TABLET | Freq: Every day | ORAL | 1 refills | Status: AC

## 2019-06-02 NOTE — Progress Notes (Signed)
BP 122/87   Pulse 80   Temp 98.1 F (36.7 C)   SpO2 98%    Subjective:    Patient ID: Evelyn Webster, female    DOB: November 09, 1980, 39 y.o.   MRN: [id number]  HPI: Evelyn Webster is a 39 y.o. female  Chief Complaint  Patient presents with  . Abdominal Pain    left mid abdominal pain, since Wednesday, steady.    ABDOMINAL PAIN  Duration:for about 5 days Onset: sudden Severity: moderate Quality: aching Location:  LUQ  Episode duration: constant Radiation: no Frequency: constant Alleviating factors:  Aggravating factors: Status: stable Treatments attempted: tylenol, nexium Fever: no Nausea: no Vomiting: no Weight loss: no Decreased appetite: no Diarrhea: no Constipation: no Blood in stool: no Heartburn: no Jaundice: no Rash: no Dysuria/urinary frequency: no Hematuria: no History of sexually transmitted disease: no Recurrent NSAID use: no  Relevant past medical, surgical, family and social history reviewed and updated as indicated. Interim medical history since our last visit reviewed. Allergies and medications reviewed and updated.  Review of Systems  Constitutional: Negative.   Respiratory: Negative.   Cardiovascular: Negative.   Gastrointestinal: Negative.   Musculoskeletal: Negative.   Neurological: Negative.   Psychiatric/Behavioral: Negative.     Per HPI unless specifically indicated above     Objective:    BP 122/87   Pulse 80   Temp 98.1 F (36.7 C)   SpO2 98%   Wt Readings from Last 3 Encounters:  05/30/19 223 lb (101.2 kg)  04/21/19 218 lb 6 oz (99.1 kg)  10/01/18 230 lb (104.3 kg)    Physical Exam Vitals and nursing note reviewed.  Constitutional:      General: She is not in acute distress.    Appearance: Normal appearance. She is not ill-appearing, toxic-appearing or diaphoretic.  HENT:     Head: Normocephalic and atraumatic.     Right Ear: External ear normal.     Left Ear: External ear normal.     Nose: Nose normal.   Mouth/Throat:     Mouth: Mucous membranes are moist.     Pharynx: Oropharynx is clear.  Eyes:     General: No scleral icterus.       Right eye: No discharge.        Left eye: No discharge.     Extraocular Movements: Extraocular movements intact.     Conjunctiva/sclera: Conjunctivae normal.     Pupils: Pupils are equal, round, and reactive to light.  Cardiovascular:     Rate and Rhythm: Normal rate and regular rhythm.     Pulses: Normal pulses.     Heart sounds: Normal heart sounds. No murmur. No friction rub. No gallop.   Pulmonary:     Effort: Pulmonary effort is normal. No respiratory distress.     Breath sounds: Normal breath sounds. No stridor. No wheezing, rhonchi or rales.  Chest:     Chest wall: No tenderness.  Musculoskeletal:        General: Normal range of motion.     Cervical back: Normal range of motion and neck supple.  Skin:    General: Skin is warm and dry.     Capillary Refill: Capillary refill takes less than 2 seconds.     Coloration: Skin is not jaundiced or pale.     Findings: No bruising, erythema, lesion or rash.  Neurological:     General: No focal deficit present.     Mental Status: She is alert and oriented to person, place,  and time. Mental status is at baseline.  Psychiatric:        Mood and Affect: Mood normal.        Behavior: Behavior normal.        Thought Content: Thought content normal.        Judgment: Judgment normal.     Results for orders placed or performed in visit on 06/02/19  CBC With Differential/Platelet  Result Value Ref Range   WBC 9.4 3.4 - 10.8 x10E3/uL   RBC 4.37 3.77 - 5.28 x10E6/uL   Hemoglobin 13.1 11.1 - 15.9 g/dL   Hematocrit 52.7 78.2 - 46.6 %   MCV 89 79 - 97 fL   MCH 30.0 26.6 - 33.0 pg   MCHC 33.8 31.5 - 35.7 g/dL   RDW 42.3 53.6 - 14.4 %   Platelets 288 150 - 450 x10E3/uL   Neutrophils 59 Not Estab. %   Lymphs 32 Not Estab. %   MID 9 Not Estab. %   Neutrophils Absolute 5.6 1.4 - 7.0 x10E3/uL   Lymphocytes  Absolute 3.0 0.7 - 3.1 x10E3/uL   MID (Absolute) 0.8 0.1 - 1.6 X10E3/uL  UA/M w/rflx Culture, Routine   Specimen: Urine   URINE  Result Value Ref Range   Specific Gravity, UA 1.025 1.005 - 1.030   pH, UA 7.0 5.0 - 7.5   Color, UA Yellow Yellow   Appearance Ur Clear Clear   Leukocytes,UA Negative Negative   Protein,UA Negative Negative/Trace   Glucose, UA Negative Negative   Ketones, UA Negative Negative   RBC, UA Negative Negative   Bilirubin, UA Negative Negative   Urobilinogen, Ur 1.0 0.2 - 1.0 mg/dL   Nitrite, UA Negative Negative      Assessment & Plan:   Problem List Items Addressed This Visit    None    Visit Diagnoses    Flank pain    -  Primary   Given previous history of L adrenal hemorrhage, low threshold for MRI. Will try muscle relaxer and recheck 2 days. Call if getting worse or not getting better.    Relevant Medications   ketorolac (TORADOL) injection 60 mg (Completed)   Other Relevant Orders   CBC With Differential/Platelet (Completed)   UA/M w/rflx Culture, Routine (Completed)       Follow up plan: Return if symptoms worsen or fail to improve.

## 2019-06-04 ENCOUNTER — Encounter: Payer: Self-pay | Admitting: Family Medicine

## 2019-06-04 NOTE — Progress Notes (Signed)
This encounter was created in error - please disregard.

## 2019-06-11 ENCOUNTER — Encounter: Payer: Self-pay | Admitting: Family Medicine

## 2019-06-11 ENCOUNTER — Telehealth: Payer: Self-pay | Admitting: Family Medicine

## 2019-06-11 NOTE — Telephone Encounter (Signed)
Called pt to schedule, no answer on 1 number, the other number a lady picked up and said she wasn't home she stated that she will have her call back.

## 2019-06-11 NOTE — Telephone Encounter (Signed)
Copied from CRM [id number]. Topic: General - Call Back - No Documentation >> Jun 11, 2019 10:02 AM Randol Kern wrote: Reason for CRM: Pt called requesting a call back to discuss whether she needs an MRI, please advise

## 2019-06-11 NOTE — Telephone Encounter (Signed)
Called and spoke with patient. She states that the pain is not any worse. She states that it is just still tender and that she cannot lay on her left side. Would she still need an appointment?

## 2019-06-11 NOTE — Telephone Encounter (Signed)
If she is still in pain, I'd like her to have an appointment- she was getting better last time I talked to her.

## 2019-06-11 NOTE — Telephone Encounter (Signed)
Yes please

## 2019-07-25 ENCOUNTER — Encounter: Payer: Self-pay | Admitting: Family Medicine

## 2019-07-25 ENCOUNTER — Ambulatory Visit: Payer: Self-pay

## 2019-07-25 ENCOUNTER — Ambulatory Visit (INDEPENDENT_AMBULATORY_CARE_PROVIDER_SITE_OTHER): Payer: PRIVATE HEALTH INSURANCE | Admitting: Family Medicine

## 2019-07-25 ENCOUNTER — Telehealth: Payer: Self-pay | Admitting: Family Medicine

## 2019-07-25 ENCOUNTER — Other Ambulatory Visit: Payer: Self-pay

## 2019-07-25 DIAGNOSIS — J3089 Other allergic rhinitis: Secondary | ICD-10-CM | POA: Diagnosis not present

## 2019-07-25 MED ORDER — PREDNISONE 50 MG PO TABS: 50.0000 mg | ORAL_TABLET | Freq: Every day | ORAL | 0 refills | Status: DC

## 2019-07-25 NOTE — Progress Notes (Signed)
There were no vitals taken for this visit.   Subjective:    Patient ID: Evelyn Webster, female    DOB: 1980-12-13, 39 y.o.   MRN: [id number]  HPI: Evelyn Webster is a 39 y.o. female  Chief Complaint  Patient presents with  . URI    pt states she has been having nasal congestion, cough, drainage, sinus pressure, and a scratchy throat   UPPER RESPIRATORY TRACT INFECTION Duration: 1 day Worst symptom: coughing, congestion Fever: no Cough: yes Shortness of breath: no Wheezing: no Chest pain: no Chest tightness: no Chest congestion: no Nasal congestion: yes Runny nose: yes Post nasal drip: yes Sneezing: no Sore throat: yes Swollen glands: no Sinus pressure: no Headache: no Face pain: no Toothache: no Ear pain: no  Ear pressure: no  Eyes red/itching:no Eye drainage/crusting: no  Vomiting: no Rash: no Fatigue: yes Sick contacts: no Strep contacts: no  Context: stable Recurrent sinusitis: no Relief with OTC cold/cough medications: no  Treatments attempted: none    Relevant past medical, surgical, family and social history reviewed and updated as indicated. Interim medical history since our last visit reviewed. Allergies and medications reviewed and updated.  Review of Systems  Constitutional: Positive for fatigue. Negative for activity change, appetite change, chills, diaphoresis, fever and unexpected weight change.  HENT: Positive for congestion, postnasal drip, rhinorrhea and sore throat. Negative for dental problem, drooling, ear discharge, ear pain, facial swelling, hearing loss, mouth sores, nosebleeds, sinus pressure, sinus pain, sneezing, tinnitus, trouble swallowing and voice change.   Eyes: Negative.   Respiratory: Positive for cough. Negative for apnea, choking, chest tightness, shortness of breath, wheezing and stridor.   Cardiovascular: Negative.   Gastrointestinal: Negative.   Musculoskeletal: Negative.   Psychiatric/Behavioral: Negative.     Per  HPI unless specifically indicated above     Objective:    There were no vitals taken for this visit.  Wt Readings from Last 3 Encounters:  05/30/19 223 lb (101.2 kg)  04/21/19 218 lb 6 oz (99.1 kg)  10/01/18 230 lb (104.3 kg)    Physical Exam Vitals and nursing note reviewed.  Pulmonary:     Effort: Pulmonary effort is normal. No respiratory distress.     Comments: Speaking in full sentences Neurological:     Mental Status: She is alert.  Psychiatric:        Mood and Affect: Mood normal.        Behavior: Behavior normal.        Thought Content: Thought content normal.        Judgment: Judgment normal.     Results for orders placed or performed in visit on 06/02/19  CBC With Differential/Platelet  Result Value Ref Range   WBC 9.4 3.4 - 10.8 x10E3/uL   RBC 4.37 3.77 - 5.28 x10E6/uL   Hemoglobin 13.1 11.1 - 15.9 g/dL   Hematocrit 38.8 34.0 - 46.6 %   MCV 89 79 - 97 fL   MCH 30.0 26.6 - 33.0 pg   MCHC 33.8 31.5 - 35.7 g/dL   RDW 14.5 11.7 - 15.4 %   Platelets 288 150 - 450 x10E3/uL   Neutrophils 59 Not Estab. %   Lymphs 32 Not Estab. %   MID 9 Not Estab. %   Neutrophils Absolute 5.6 1.4 - 7.0 x10E3/uL   Lymphocytes Absolute 3.0 0.7 - 3.1 x10E3/uL   MID (Absolute) 0.8 0.1 - 1.6 X10E3/uL  UA/M w/rflx Culture, Routine   Specimen: Urine   URINE  Result Value  Ref Range   Specific Gravity, UA 1.025 1.005 - 1.030   pH, UA 7.0 5.0 - 7.5   Color, UA Yellow Yellow   Appearance Ur Clear Clear   Leukocytes,UA Negative Negative   Protein,UA Negative Negative/Trace   Glucose, UA Negative Negative   Ketones, UA Negative Negative   RBC, UA Negative Negative   Bilirubin, UA Negative Negative   Urobilinogen, Ur 1.0 0.2 - 1.0 mg/dL   Nitrite, UA Negative Negative      Assessment & Plan:   Problem List Items Addressed This Visit      Respiratory   Allergic rhinitis due to allergen - Primary    Has not been taking her singulair or zyrtec. Restart. Burst of prednisone. If  not better by Monday- Covid Test. Call with any concerns or if not improving.           Follow up plan: Return if symptoms worsen or fail to improve.   . This visit was completed via telephone due to the restrictions of the COVID-19 pandemic. All issues as above were discussed and addressed but no physical exam was performed. If it was felt that the patient should be evaluated in the office, they were directed there. The patient verbally consented to this visit. Patient was unable to complete an audio/visual visit due to Lack of equipment. Due to the catastrophic nature of the COVID-19 pandemic, this visit was done through audio contact only. . Location of the patient: home . Location of the provider: work . Those involved with this call:  . Provider: Olevia Perches, DO . CMA: Wilhemena Durie, CMA . Front Desk/Registration: Adela Ports  . Time spent on call: 15 minutes on the phone discussing health concerns. 23 minutes total spent in review of patient's record and preparation of their chart.

## 2019-07-25 NOTE — Telephone Encounter (Signed)
Patient believes she has a possible sinus infection, symptoms started on yesterday. Patient has cough that she believes is coming from drainage from nose.  Patient wants to know what type of over-the counter medicine she should take due to her "glands bleeding" past, she wants to make sure.

## 2019-07-25 NOTE — Assessment & Plan Note (Signed)
Has not been taking her singulair or zyrtec. Restart. Burst of prednisone. If not better by Monday- Covid Test. Call with any concerns or if not improving.

## 2019-07-25 NOTE — Telephone Encounter (Addendum)
Patient called and says she's having sinus congestion and wanted to know what could be taken OTC. She says she has some pain over her nose, 6/10, clear drainage from her nose. She says the drainage is down her throat into her chest and it's making her throat sore and her cough. She denies fever. She says she's coughing more and more since this all started yesterday. COVID screening indicates virtual visit. Appointment scheduled for today at 1600 with Dr. Laural Benes, care advice given, she verbalized understanding.  Reason for Disposition . Lots of coughing  Answer Assessment - Initial Assessment Questions 1. LOCATION: "Where does it hurt?"      Over nose 2. ONSET: "When did the sinus pain start?"  (e.g., hours, days)      Yesterday 3. SEVERITY: "How bad is the pain?"   (Scale 1-10; mild, moderate or severe)   - MILD (1-3): doesn't interfere with normal activities    - MODERATE (4-7): interferes with normal activities (e.g., work or school) or awakens from sleep   - SEVERE (8-10): excruciating pain and patient unable to do any normal activities       6 4. RECURRENT SYMPTOM: "Have you ever had sinus problems before?" If so, ask: "When was the last time?" and "What happened that time?"      Yes, last year; had to go to the doctor 5. NASAL CONGESTION: "Is the nose blocked?" If so, ask, "Can you open it or must you breathe through the mouth?"     Not blocked 6. NASAL DISCHARGE: "Do you have discharge from your nose?" If so ask, "What color?"     Clear when blow 7. FEVER: "Do you have a fever?" If so, ask: "What is it, how was it measured, and when did it start?"      No 8. OTHER SYMPTOMS: "Do you have any other symptoms?" (e.g., sore throat, cough, earache, difficulty breathing)     Cough, sore throat, sinus drainage 9. PREGNANCY: "Is there any chance you are pregnant?" "When was your last menstrual period?"     No; LMP 07/08/19  Protocols used: SINUS PAIN OR CONGESTION-A-AH

## 2019-07-25 NOTE — Telephone Encounter (Signed)
See triage encounter.

## 2019-07-28 ENCOUNTER — Telehealth: Payer: Self-pay | Admitting: Family Medicine

## 2019-07-28 ENCOUNTER — Other Ambulatory Visit: Payer: Self-pay

## 2019-07-28 ENCOUNTER — Encounter: Payer: Self-pay | Admitting: Nurse Practitioner

## 2019-07-28 ENCOUNTER — Telehealth (INDEPENDENT_AMBULATORY_CARE_PROVIDER_SITE_OTHER): Payer: PRIVATE HEALTH INSURANCE | Admitting: Nurse Practitioner

## 2019-07-28 ENCOUNTER — Ambulatory Visit: Payer: Medicaid Other | Attending: Internal Medicine

## 2019-07-28 VITALS — BP 131/89 | HR 106 | Temp 97.4°F | Ht 61.0 in | Wt 230.0 lb

## 2019-07-28 DIAGNOSIS — R05 Cough: Secondary | ICD-10-CM

## 2019-07-28 DIAGNOSIS — Z20822 Contact with and (suspected) exposure to covid-19: Secondary | ICD-10-CM

## 2019-07-28 DIAGNOSIS — R059 Cough, unspecified: Secondary | ICD-10-CM

## 2019-07-28 MED ORDER — GUAIFENESIN ER 600 MG PO TB12: 600.0000 mg | ORAL_TABLET | Freq: Two times a day (BID) | ORAL | 0 refills | Status: DC

## 2019-07-28 NOTE — Patient Instructions (Signed)
3 Key Steps to Take While Waiting for Your COVID-19 Test Result °To help stop the spread of COVID-19, take these 3 key steps NOW while waiting for your test results: °1. Stay home and monitor your health. °Stay home and monitor your health to help protect your friends, family, and others from possibly getting COVID-19 from you. °Stay home and away from others: °· If possible, stay away from others, especially people who are at higher risk for getting very sick from COVID-19, such as older adults and people with other medical conditions. °· If you have been in contact with someone with COVID-19, stay home and away from others for 14 days after your last contact with that person. °· If you have a fever, cough or other symptoms of COVID-19, stay home and away from others (except to get medical care). °Monitor your health: °· Watch for fever, cough, shortness of breath, or other symptoms of COVID-19. Remember, symptoms may appear 2-14 days after exposure to COVID-19 and can include: °? Fever or chills °? Cough °? Shortness of breath or difficulty breathing °? Tiredness °? Muscle or body aches °? Headache °? New loss of taste or smell °? Sore throat °? Congestion or runny nose °? Nausea or vomiting °? Diarrhea °2. Think about the people you have recently been around. °If you are diagnosed with COVID-19, a public health worker may call you to check on your health, discuss who you have been around, and ask where you spent time while you may have been able to spread COVID-19 to others. While you wait for your COVID-19 test result, think about everyone you have been around recently. This will be important information to give health workers if your test is positive.  °Complete the information on the back of this page to help you remember everyone you have been around. °3. Answer the phone call from the health department. °If a public health worker calls you, answer the call to help slow the spread of COVID-19 in your  community. °· Discussions with health department staff are confidential. This means that your personal and medical information will be kept private and only shared with those who may need to know, like your health care provider. °· Your name will not be shared with those you came in contact with. The health department will only notify people you were in close contact with (within 6 feet for more than 15 minutes) that they might have been exposed to COVID-19. °Think about the people you have recently been around °If you test positive and are diagnosed with COVID-19, someone from the health department may call to check-in on your health, discuss who you have been around, and ask where you spent time while you may have been able to spread COVID-19 to others. This form can help you think about people you have recently been around so you will be ready if a public health worker calls you. °Things to think about. Have you: °· Gone to work or school? °· Gotten together with others (eaten out at a restaurant, gone out for drinks, exercised with others or gone to a gym, had friends or family over to your house, volunteered, gone to a party, pool, or park)? °· Gone to a store in person (e.g., grocery store, mall)? °· Gone to in-person appointments (e.g., salon, barber, doctor's or dentist's office)? °· Ridden in a car with others (e.g., Uber or Lyft) or took public transportation? °· Been inside a church, synagogue, mosque or other places   of worship? °Who lives with you? °· ______________________________________________________________________ °· ______________________________________________________________________ °· ______________________________________________________________________ °· ______________________________________________________________________ °Who have you been around (within 6 feet for more than 15 minutes) in the last 10 days? (You may have more people to list than the space provided. If so, write on the  front of this sheet or a separate piece of paper.) °Name ______________________________________________ °· Phone number ____________________________________ °· Date you last saw them _____________________________ °· Where you last saw them ________________________________________________ °Name ______________________________________________ °· Phone number ____________________________________ °· Date you last saw them _____________________________ °· Where you last saw them ________________________________________________ °Name ______________________________________________ °· Phone number ____________________________________ °· Date you last saw them _____________________________ °· Where you last saw them ________________________________________________ °Name ______________________________________________ °· Phone number ____________________________________ °· Date you last saw them _____________________________ °· Where you last saw them ________________________________________________ °Name ______________________________________________ °· Phone number ____________________________________ °· Date you last saw them _____________________________ °· Where you last saw them ________________________________________________ °What have you done in the last 10 days with other people? °Activity _____________________________________________ °· Location _________________________________________ °· Date ____________________________________________ °Activity _____________________________________________ °· Location _________________________________________ °· Date ____________________________________________ °Activity _____________________________________________ °· Location _________________________________________ °· Date ____________________________________________ °Activity _____________________________________________ °· Location _________________________________________ °· Date  ____________________________________________ °Activity _____________________________________________ °· Location _________________________________________ °· Date ____________________________________________ °cdc.gov/coronavirus °09/30/2018 °This information is not intended to replace advice given to you by your health care provider. Make sure you discuss any questions you have with your health care provider. °Document Revised: 02/06/2019 Document Reviewed: 02/06/2019 °[employer]vier Patient Education © 2020 [employer] ° °

## 2019-07-28 NOTE — Assessment & Plan Note (Addendum)
Acute, ongoing.  Likely viral cause as patient has restarted allergy medications and recently started prednisone.  COVID-19 test pending.  Continue supportive care with hydration, mucinex, and current medications.  Patient to call or return to clinic by end of week with no improvement.

## 2019-07-28 NOTE — Telephone Encounter (Signed)
Virtual appt scheduled, will schedule COVID testing.
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
## 2019-07-28 NOTE — Telephone Encounter (Signed)
Copied from CRM [id number]. Topic: General - Inquiry >> Jul 28, 2019  9:05 AM Daphine Deutscher D wrote: Reason for CRM: Pt called saying she has had sinus and coughing for several days.  She spoke with Dr. Laural Benes and was prescribed prednisone on Saturday but is not feeling any better and Dr. Karn Pickler her to call back on Monday if she was not better.  CB#  [phone]  [facility] [place]

## 2019-07-29 LAB — NOVEL CORONAVIRUS, NAA: SARS-CoV-2, NAA: NOT DETECTED

## 2019-07-29 LAB — SARS-COV-2, NAA 2 DAY TAT

## 2019-08-01 ENCOUNTER — Ambulatory Visit (INDEPENDENT_AMBULATORY_CARE_PROVIDER_SITE_OTHER): Payer: PRIVATE HEALTH INSURANCE | Admitting: Family Medicine

## 2019-08-01 ENCOUNTER — Encounter: Payer: Self-pay | Admitting: Family Medicine

## 2019-08-01 VITALS — BP 123/83 | HR 82 | Temp 96.7°F

## 2019-08-01 DIAGNOSIS — J0100 Acute maxillary sinusitis, unspecified: Secondary | ICD-10-CM | POA: Diagnosis not present

## 2019-08-01 MED ORDER — PREDNISONE 50 MG PO TABS: 50.0000 mg | ORAL_TABLET | Freq: Every day | ORAL | 0 refills | Status: DC

## 2019-08-01 MED ORDER — AMOXICILLIN-POT CLAVULANATE 875-125 MG PO TABS: 1.0000 | ORAL_TABLET | Freq: Two times a day (BID) | ORAL | 0 refills | Status: DC

## 2019-08-01 NOTE — Progress Notes (Signed)
BP 123/83   Pulse 82   Temp (!) 96.7 F (35.9 C)   LMP 07/29/2019 (Approximate)    Subjective:    Patient ID: Evelyn Webster, female    DOB: May 20, 1980, 39 y.o.   MRN: [id number]  HPI: Evelyn Webster is a 39 y.o. female  Chief Complaint  Patient presents with  . Sinus Problem    about a week  . Cough   UPPER RESPIRATORY TRACT INFECTION- got Covid tested on Monday and no better Duration: x 1 week Worst symptom: cough, chest congestion Fever: no Cough: yes Shortness of breath: no Wheezing: yes Chest pain: no Chest tightness: no Chest congestion: yes Nasal congestion: no Runny nose: yes Post nasal drip: no Sneezing: no Sore throat: no Swollen glands: no Sinus pressure: yes Headache: no Face pain: no Toothache: no Ear pain: no  Ear pressure: no  Eyes red/itching:no Eye drainage/crusting: no  Vomiting: no Rash: no Fatigue: yes Sick contacts: no Strep contacts: no  Context: stable Recurrent sinusitis: no Relief with OTC cold/cough medications: no  Treatments attempted: prednisone, zyrtec, singulair, guaifensin   Relevant past medical, surgical, family and social history reviewed and updated as indicated. Interim medical history since our last visit reviewed. Allergies and medications reviewed and updated.  Review of Systems  Constitutional: Positive for chills, diaphoresis and fatigue. Negative for activity change, appetite change, fever and unexpected weight change.  HENT: Positive for congestion. Negative for dental problem, drooling, ear discharge, ear pain, facial swelling, hearing loss, mouth sores, nosebleeds, postnasal drip, rhinorrhea, sinus pressure, sinus pain, sneezing, sore throat, tinnitus, trouble swallowing and voice change.   Respiratory: Positive for cough. Negative for apnea, choking, chest tightness, shortness of breath, wheezing and stridor.   Cardiovascular: Negative.   Gastrointestinal: Negative.   Musculoskeletal: Negative.     Psychiatric/Behavioral: Negative.     Per HPI unless specifically indicated above     Objective:    BP 123/83   Pulse 82   Temp (!) 96.7 F (35.9 C)   LMP 07/29/2019 (Approximate)   Wt Readings from Last 3 Encounters:  07/28/19 230 lb (104.3 kg)  05/30/19 223 lb (101.2 kg)  04/21/19 218 lb 6 oz (99.1 kg)    Physical Exam Vitals and nursing note reviewed.  Pulmonary:     Effort: Pulmonary effort is normal. No respiratory distress.     Comments: Speaking in full sentences Neurological:     Mental Status: She is alert.  Psychiatric:        Mood and Affect: Mood normal.        Behavior: Behavior normal.        Thought Content: Thought content normal.        Judgment: Judgment normal.     Results for orders placed or performed in visit on 07/28/19  Novel Coronavirus, NAA ([facility])   Specimen: Nasopharyngeal(NP) swabs in vial transport medium   NASOPHARYNGE  TESTING  Result Value Ref Range   SARS-CoV-2, NAA Not Detected Not Detected  SARS-COV-2, NAA 2 DAY TAT   NASOPHARYNGE  TESTING  Result Value Ref Range   SARS-CoV-2, NAA 2 DAY TAT Performed       Assessment & Plan:   Problem List Items Addressed This Visit    None    Visit Diagnoses    Acute non-recurrent maxillary sinusitis    -  Primary   Will treat with augmentin and prednisone burst. Call if not getting better or getting worse. Continue to monitor.    Relevant Medications  amoxicillin-clavulanate (AUGMENTIN) 875-125 MG tablet   predniSONE (DELTASONE) 50 MG tablet       Follow up plan: Return if symptoms worsen or fail to improve.   . This visit was completed via telephone due to the restrictions of the COVID-19 pandemic. All issues as above were discussed and addressed but no physical exam was performed. If it was felt that the patient should be evaluated in the office, they were directed there. The patient verbally consented to this visit. Patient was unable to complete an audio/visual visit due to  Lack of equipment. Due to the catastrophic nature of the COVID-19 pandemic, this visit was done through audio contact only. . Location of the patient: home . Location of the provider: work . Those involved with this call:  . Provider: Olevia Perches, DO . CMA: Floydene Flock, RMA . Front Desk/Registration: Adela Ports  . Time spent on call: 15 minutes on the phone discussing health concerns. 23 minutes total spent in review of patient's record and preparation of their chart.

## 2019-08-19 ENCOUNTER — Encounter: Payer: Self-pay | Admitting: Family Medicine

## 2019-08-19 ENCOUNTER — Telehealth (INDEPENDENT_AMBULATORY_CARE_PROVIDER_SITE_OTHER): Payer: PRIVATE HEALTH INSURANCE | Admitting: Family Medicine

## 2019-08-19 ENCOUNTER — Other Ambulatory Visit: Payer: Self-pay

## 2019-08-19 VITALS — BP 130/84 | HR 90 | Ht 61.0 in | Wt 230.0 lb

## 2019-08-19 DIAGNOSIS — K625 Hemorrhage of anus and rectum: Secondary | ICD-10-CM | POA: Diagnosis not present

## 2019-08-19 DIAGNOSIS — R05 Cough: Secondary | ICD-10-CM

## 2019-08-19 DIAGNOSIS — R059 Cough, unspecified: Secondary | ICD-10-CM

## 2019-08-19 MED ORDER — HYDROCORTISONE (PERIANAL) 2.5 % EX CREA: 1.0000 "application " | TOPICAL_CREAM | Freq: Two times a day (BID) | CUTANEOUS | 0 refills | Status: DC

## 2019-08-19 MED ORDER — PREDNISONE 10 MG PO TABS: ORAL_TABLET | ORAL | 0 refills | Status: DC

## 2019-08-19 NOTE — Patient Instructions (Signed)
How to Take a [employer] A sitz bath is a warm water bath that may be used to care for your rectum, genital area, or the area between your rectum and genitals (perineum). For a sitz bath, the water only comes up to your hips and covers your buttocks. A sitz bath may done at home in a bathtub or with a portable sitz bath that fits over the toilet. Your health care provider may recommend a sitz bath to help:  Relieve pain and discomfort after delivering a baby.  Relieve pain and itching from hemorrhoids or anal fissures.  Relieve pain after certain surgeries.  Relax muscles that are sore or tight. How to take a sitz bath Take 3-4 sitz baths a day, or as many as told by your health care provider. Bathtub sitz bath To take a sitz bath in a bathtub: 1. Partially fill a bathtub with warm water. The water should be deep enough to cover your hips and buttocks when you are sitting in the tub. 2. If your health care provider told you to put medicine in the water, follow his or her instructions. 3. Sit in the water. 4. Open the tub drain a little, and leave it open during your bath. 5. Turn on the warm water again, enough to replace the water that is draining out. Keep the water running throughout your bath. This helps keep the water at the right level and the right temperature. 6. Soak in the water for 15-20 minutes, or as long as told by your health care provider. 7. When you are done, be careful when you stand up. You may feel dizzy. 8. After the sitz bath, pat yourself dry. Do not rub your skin to dry it.  Over-the-toilet sitz bath To take a sitz bath with an over-the-toilet basin: 1. Follow the manufacturer's instructions. 2. Fill the basin with warm water. 3. If your health care provider told you to put medicine in the water, follow his or her instructions. 4. Sit on the seat. Make sure the water covers your buttocks and perineum. 5. Soak in the water for 15-20 minutes, or as long as told by  your health care provider. 6. After the sitz bath, pat yourself dry. Do not rub your skin to dry it. 7. Clean and dry the basin between uses. 8. Discard the basin if it cracks, or according to the manufacturer's instructions. Contact a health care provider if:  Your symptoms get worse. Do not continue with sitz baths if your symptoms get worse.  You have new symptoms. If this happens, do not continue with sitz baths until you talk with your health care provider. Summary  A sitz bath is a warm water bath in which the water only comes up to your hips and covers your buttocks.  A sitz bath may help relieve itching, relieve pain, and relax muscles that are sore or tight in the lower part of your body, including your genital area.  Take 3-4 sitz baths a day, or as many as told by your health care provider. Soak in the water for 15-20 minutes.  Do not continue with sitz baths if your symptoms get worse. This information is not intended to replace advice given to you by your health care provider. Make sure you discuss any questions you have with your health care provider. Document Revised: 07/22/2018 Document Reviewed: 02/22/2017 Elsevier Patient Education  2020 [employer].  Hemorrhoids Hemorrhoids are swollen veins that may develop:  In the butt (  rectum). These are called internal hemorrhoids.  Around the opening of the butt (anus). These are called external hemorrhoids. Hemorrhoids can cause pain, itching, or bleeding. Most of the time, they do not cause serious problems. They usually get better with diet changes, lifestyle changes, and other home treatments. What are the causes? This condition may be caused by:  Having trouble pooping (constipation).  Pushing hard (straining) to poop.  Watery poop (diarrhea).  Pregnancy.  Being very overweight (obese).  Sitting for long periods of time.  Heavy lifting or other activity that causes you to strain.  Anal sex.  Riding a  bike for a long period of time. What are the signs or symptoms? Symptoms of this condition include:  Pain.  Itching or soreness in the butt.  Bleeding from the butt.  Leaking poop.  Swelling in the area.  One or more lumps around the opening of your butt. How is this diagnosed? A doctor can often diagnose this condition by looking at the affected area. The doctor may also:  Do an exam that involves feeling the area with a gloved hand (digital rectal exam).  Examine the area inside your butt using a small tube (anoscope).  Order blood tests. This may be done if you have lost a lot of blood.  Have you get a test that involves looking inside the colon using a flexible tube with a camera on the end (sigmoidoscopy or colonoscopy). How is this treated? This condition can usually be treated at home. Your doctor may tell you to change what you eat, make lifestyle changes, or try home treatments. If these do not help, procedures can be done to remove the hemorrhoids or make them smaller. These may involve:  Placing rubber bands at the base of the hemorrhoids to cut off their blood supply.  Injecting medicine into the hemorrhoids to shrink them.  Shining a type of light energy onto the hemorrhoids to cause them to fall off.  Doing surgery to remove the hemorrhoids or cut off their blood supply. Follow these instructions at home: Eating and drinking   Eat foods that have a lot of fiber in them. These include whole grains, beans, nuts, fruits, and vegetables.  Ask your doctor about taking products that have added fiber (fibersupplements).  Reduce the amount of fat in your diet. You can do this by: ? Eating low-fat dairy products. ? Eating less red meat. ? Avoiding processed foods.  Drink enough fluid to keep your pee (urine) pale yellow. Managing pain and swelling   Take a warm-water bath (sitz bath) for 20 minutes to ease pain. Do this 3-4 times a day. You may do this in a  bathtub or using a portable sitz bath that fits over the toilet.  If told, put ice on the painful area. It may be helpful to use ice between your warm baths. ? Put ice in a plastic bag. ? Place a towel between your skin and the bag. ? Leave the ice on for 20 minutes, 2-3 times a day. General instructions  Take over-the-counter and prescription medicines only as told by your doctor. ? Medicated creams and medicines may be used as told.  Exercise often. Ask your doctor how much and what kind of exercise is best for you.  Go to the bathroom when you have the urge to poop. Do not wait.  Avoid pushing too hard when you poop.  Keep your butt dry and clean. Use wet toilet paper or moist towelettes  after pooping.  Do not sit on the toilet for a long time.  Keep all follow-up visits as told by your doctor. This is important. Contact a doctor if you:  Have pain and swelling that do not get better with treatment or medicine.  Have trouble pooping.  Cannot poop.  Have pain or swelling outside the area of the hemorrhoids. Get help right away if you have:  Bleeding that will not stop. Summary  Hemorrhoids are swollen veins in the butt or around the opening of the butt.  They can cause pain, itching, or bleeding.  Eat foods that have a lot of fiber in them. These include whole grains, beans, nuts, fruits, and vegetables.  Take a warm-water bath (sitz bath) for 20 minutes to ease pain. Do this 3-4 times a day. This information is not intended to replace advice given to you by your health care provider. Make sure you discuss any questions you have with your health care provider. Document Revised: 02/28/2018 Document Reviewed: 07/12/2017 Elsevier Patient Education  2020 [employer].

## 2019-08-19 NOTE — Assessment & Plan Note (Signed)
Still not resolved. Will treat with prednisone taper. Call if not getting better or getting worse.

## 2019-08-19 NOTE — Progress Notes (Signed)
BP 130/84 (BP Location: Right Arm, Patient Position: Sitting, Cuff Size: Normal)   Pulse 90   Ht 5\' 1"  (1.549 m)   Wt 230 lb (104.3 kg)   LMP 07/29/2019 (Approximate)   BMI 43.46 kg/m    Subjective:    Patient ID: Evelyn Webster, female    DOB: 02-Aug-1980, 39 y.o.   MRN: [id number]  HPI: Evelyn Webster is a 39 y.o. female  Chief Complaint  Patient presents with  . Cough    Patient states that she is still coughing.  . Rectal Bleeding    Patient states that being sick upset her stomach and patient states dark red blood in toilet, on toilet paper, and around stool. Reports pain. Bleeding is now on and off.   . Rectal Pain   RECTAL BLEEDING- was having a lot of diarrhea and she was very raw in her anal area Duration: about 3 weeks Bright red rectal bleeding: yes  Amount of blood: spotting  Frequency: on and off, not every time she goes to the bathroom Melena: no  Spotting on toilet tissue: yes  Anal fullness: no  Perianal pain: yes  Severity: moderate Perianal irritation/itching: no  Constipation: no  Chronic straining/valsava:  no  Anal trauma/intercourse: no  Hemorrhoids: no  Previous colonoscopy: no   She has still been coughing. Feeling better with the augmentin, but still not right. She's been sick for about 2 weeks, coughing and wheezing. No fevers. Some congestion. Otherwise doing OK.  Relevant past medical, surgical, family and social history reviewed and updated as indicated. Interim medical history since our last visit reviewed. Allergies and medications reviewed and updated.  Review of Systems  Constitutional: Negative.   HENT: Negative.   Respiratory: Positive for cough and wheezing. Negative for apnea, choking, chest tightness, shortness of breath and stridor.   Cardiovascular: Negative.   Gastrointestinal: Positive for anal bleeding. Negative for abdominal distention, abdominal pain, blood in stool, constipation, diarrhea, nausea, rectal pain and  vomiting.  Skin: Negative.   Psychiatric/Behavioral: Negative.     Per HPI unless specifically indicated above     Objective:    BP 130/84 (BP Location: Right Arm, Patient Position: Sitting, Cuff Size: Normal)   Pulse 90   Ht 5\' 1"  (1.549 m)   Wt 230 lb (104.3 kg)   LMP 07/29/2019 (Approximate)   BMI 43.46 kg/m   Wt Readings from Last 3 Encounters:  08/19/19 230 lb (104.3 kg)  07/28/19 230 lb (104.3 kg)  05/30/19 223 lb (101.2 kg)    Physical Exam Vitals and nursing note reviewed.  Constitutional:      General: She is not in acute distress.    Appearance: Normal appearance. She is not ill-appearing, toxic-appearing or diaphoretic.  HENT:     Head: Normocephalic and atraumatic.     Right Ear: External ear normal.     Left Ear: External ear normal.     Nose: Nose normal.     Mouth/Throat:     Mouth: Mucous membranes are moist.     Pharynx: Oropharynx is clear.  Eyes:     General: No scleral icterus.       Right eye: No discharge.        Left eye: No discharge.     Conjunctiva/sclera: Conjunctivae normal.     Pupils: Pupils are equal, round, and reactive to light.  Pulmonary:     Effort: Pulmonary effort is normal. No respiratory distress.     Comments: Speaking in full sentences  Musculoskeletal:        General: Normal range of motion.     Cervical back: Normal range of motion.  Skin:    Coloration: Skin is not jaundiced or pale.     Findings: No bruising, erythema, lesion or rash.  Neurological:     Mental Status: She is alert and oriented to person, place, and time. Mental status is at baseline.  Psychiatric:        Mood and Affect: Mood normal.        Behavior: Behavior normal.        Thought Content: Thought content normal.        Judgment: Judgment normal.     Results for orders placed or performed in visit on 07/28/19  Novel Coronavirus, NAA ([facility])   Specimen: Nasopharyngeal(NP) swabs in vial transport medium   NASOPHARYNGE  TESTING  Result Value  Ref Range   SARS-CoV-2, NAA Not Detected Not Detected  SARS-COV-2, NAA 2 DAY TAT   NASOPHARYNGE  TESTING  Result Value Ref Range   SARS-CoV-2, NAA 2 DAY TAT Performed       Assessment & Plan:   Problem List Items Addressed This Visit      Other   Cough - Primary    Still not resolved. Will treat with prednisone taper. Call if not getting better or getting worse.       Other Visit Diagnoses    Rectal bleeding       Will treat hemorrhoids and start sitz baths. If not better by Friday- let us know.        Follow up plan: Return if symptoms worsen or fail to improve.    . This visit was completed via MyChart due to the restrictions of the COVID-19 pandemic. All issues as above were discussed and addressed. Physical exam was done as above through visual confirmation on MyChart. If it was felt that the patient should be evaluated in the office, they were directed there. The patient verbally consented to this visit. . Location of the patient: home . Location of the provider: work . Those involved with this call:  . Provider: Olevia Perches, DO . CMA: Myrtha Mantis, CMA . Front Desk/Registration: Adela Ports  . Time spent on call: 25 minutes with patient face to face via video conference. More than 50% of this time was spent in counseling and coordination of care. 40 minutes total spent in review of patient's record and preparation of their chart.

## 2019-08-28 ENCOUNTER — Other Ambulatory Visit: Payer: Self-pay | Admitting: Family Medicine

## 2019-09-19 ENCOUNTER — Other Ambulatory Visit: Payer: Self-pay | Admitting: Family Medicine

## 2019-09-25 ENCOUNTER — Encounter: Payer: Self-pay | Admitting: Family Medicine

## 2019-09-25 ENCOUNTER — Telehealth (INDEPENDENT_AMBULATORY_CARE_PROVIDER_SITE_OTHER): Payer: PRIVATE HEALTH INSURANCE | Admitting: Family Medicine

## 2019-09-25 VITALS — Wt 220.0 lb

## 2019-09-25 DIAGNOSIS — J029 Acute pharyngitis, unspecified: Secondary | ICD-10-CM

## 2019-09-25 MED ORDER — LIDOCAINE VISCOUS HCL 2 % MT SOLN
5.0000 mL | OROMUCOSAL | 0 refills | Status: DC | PRN
Start: 2019-09-25 — End: 2020-07-19

## 2019-09-25 MED ORDER — AZITHROMYCIN 250 MG PO TABS
ORAL_TABLET | ORAL | 0 refills | Status: DC
Start: 2019-09-25 — End: 2019-10-02

## 2019-09-25 NOTE — Progress Notes (Signed)
Wt (!) 220 lb (99.8 kg)   BMI 41.57 kg/m    Subjective:    Patient ID: Evelyn Webster, female    DOB: 1980-05-15, 39 y.o.   MRN: [id number]  HPI: Evelyn Webster is a 39 y.o. female  Chief Complaint  Patient presents with  . Sore Throat    since Monday  . Cough    . This visit was completed via MyChart due to the restrictions of the COVID-19 pandemic. All issues as above were discussed and addressed. Physical exam was done as above through visual confirmation on MyChart. If it was felt that the patient should be evaluated in the office, they were directed there. The patient verbally consented to this visit. . Location of the patient: work . Location of the provider: work . Those involved with this call:  . Provider: Roosvelt Maser, PA-C . CMA: Elton Sin, CMA . Front Desk/Registration: Adela Ports  . Time spent on call: 15 minutes with patient face to face via video conference. More than 50% of this time was spent in counseling and coordination of care. 5 minutes total spent in review of patient's record and preparation of their chart. I verified patient identity using two factors (patient name and date of birth). Patient consents verbally to being seen via telemedicine visit today.  **Had to convert to phone call after 1-2 minutes due to connectivity loss, completed remainder of visit over phone call**  Here today for 5+ days of sore swollen throat, malaise, and worsened cough. Denies fever, chills, congestion, dyphagia, sick contacts, recent travel. Taking her typical allergy and inhaler regimen which hasn't been helping. Hx of allergic rhinitis and COPD.   Relevant past medical, surgical, family and social history reviewed and updated as indicated. Interim medical history since our last visit reviewed. Allergies and medications reviewed and updated.  Review of Systems  Per HPI unless specifically indicated above     Objective:    Wt (!) 220 lb (99.8 kg)   BMI  41.57 kg/m   Wt Readings from Last 3 Encounters:  09/25/19 (!) 220 lb (99.8 kg)  08/19/19 230 lb (104.3 kg)  07/28/19 230 lb (104.3 kg)    Physical Exam Vitals and nursing note reviewed.  Constitutional:      General: She is not in acute distress.    Appearance: Normal appearance.  HENT:     Head: Atraumatic.     Right Ear: External ear normal.     Left Ear: External ear normal.     Nose: Nose normal. No congestion.     Mouth/Throat:     Comments: Did not get to visualize throat prior to connectivity loss Eyes:     Extraocular Movements: Extraocular movements intact.     Conjunctiva/sclera: Conjunctivae normal.  Cardiovascular:     Comments: Unable to assess via virtual visit Pulmonary:     Effort: Pulmonary effort is normal. No respiratory distress.  Musculoskeletal:        General: Normal range of motion.     Cervical back: Normal range of motion.  Skin:    General: Skin is dry.     Findings: No erythema.  Neurological:     Mental Status: She is alert and oriented to person, place, and time.  Psychiatric:        Mood and Affect: Mood normal.        Thought Content: Thought content normal.        Judgment: Judgment normal.     Results  for orders placed or performed in visit on 07/28/19  Novel Coronavirus, NAA ([facility])   Specimen: Nasopharyngeal(NP) swabs in vial transport medium   NASOPHARYNGE  TESTING  Result Value Ref Range   SARS-CoV-2, NAA Not Detected Not Detected  SARS-COV-2, NAA 2 DAY TAT   NASOPHARYNGE  TESTING  Result Value Ref Range   SARS-CoV-2, NAA 2 DAY TAT Performed       Assessment & Plan:   Problem List Items Addressed This Visit    None    Visit Diagnoses    Pharyngitis, unspecified etiology    -  Primary   Concern for bacterial component. Recent augmentin course so will tx with zpak, viscous lidocaine, salt water gargles, rest. F/u if not resolving       Follow up plan: Return if symptoms worsen or fail to improve.

## 2019-10-02 ENCOUNTER — Other Ambulatory Visit: Payer: Self-pay

## 2019-10-02 ENCOUNTER — Ambulatory Visit (INDEPENDENT_AMBULATORY_CARE_PROVIDER_SITE_OTHER): Payer: PRIVATE HEALTH INSURANCE | Admitting: Family Medicine

## 2019-10-02 ENCOUNTER — Encounter: Payer: Self-pay | Admitting: Family Medicine

## 2019-10-02 VITALS — BP 118/81 | HR 83 | Temp 97.8°F | Wt 218.2 lb

## 2019-10-02 DIAGNOSIS — M7661 Achilles tendinitis, right leg: Secondary | ICD-10-CM

## 2019-10-02 DIAGNOSIS — L243 Irritant contact dermatitis due to cosmetics: Secondary | ICD-10-CM

## 2019-10-02 MED ORDER — KETOROLAC TROMETHAMINE 60 MG/2ML IM SOLN
60.0000 mg | Freq: Once | INTRAMUSCULAR | Status: AC
  Administered 2019-10-02: 60 mg via INTRAMUSCULAR

## 2019-10-02 MED ORDER — MUPIROCIN 2 % EX OINT: 1.0000 "application " | TOPICAL_OINTMENT | Freq: Two times a day (BID) | CUTANEOUS | 0 refills | Status: DC

## 2019-10-02 MED ORDER — TRIAMCINOLONE ACETONIDE 0.5 % EX OINT
1.0000 | TOPICAL_OINTMENT | Freq: Two times a day (BID) | CUTANEOUS | 0 refills | Status: DC
Start: 2019-10-02 — End: 2020-01-16

## 2019-10-02 MED ORDER — DICLOFENAC SODIUM 75 MG PO TBEC: 75.0000 mg | DELAYED_RELEASE_TABLET | Freq: Two times a day (BID) | ORAL | 0 refills | Status: DC

## 2019-10-02 NOTE — Patient Instructions (Signed)
[employer]'s Emergency Medicine: Concepts and Clinical Practice (9th ed., pp. 1392-1401). [place], [place]: [employer] Retrieved from https://www.clinicalkey.com/#!/content/[email or web address]">  Achilles Tendinitis  Achilles tendinitis is inflammation of the tough, cord-like band that attaches the lower leg muscles to the heel bone (Achilles tendon). This is usually caused by overusing the tendon and the ankle joint. Achilles tendinitis usually gets better over time with treatment and caring for yourself at home. It can take weeks or months to heal completely. What are the causes? This condition may be caused by:  A sudden increase in exercise or activity, such as running.  Doing the same exercises or activities, such as jumping, over and over.  Not warming up calf muscles before exercising.  Exercising in shoes that are worn out or not made for exercise.  Having arthritis or a bone growth (spur) on the back of the heel bone. This can rub against the tendon and hurt it.  Age-related wear and tear. Tendons become less flexible with age and are more likely to be injured. What are the signs or symptoms? Common symptoms of this condition include:  Pain in the Achilles tendon or in the back of the leg, just above the heel. The pain usually gets worse with exercise.  Stiffness or soreness in the back of the leg, especially in the morning.  Swelling of the skin over the Achilles tendon.  Thickening of the tendon.  Trouble standing on tiptoe. How is this diagnosed? This condition is diagnosed based on your symptoms and a physical exam. You may have tests, including:  X-rays.  MRI. How is this treated? The goal of treatment is to relieve symptoms and help your injury heal. Treatment may include:  Decreasing or stopping activities that caused the tendinitis. This may mean switching to low-impact exercises like biking or swimming.  Icing the injured  area.  Doing physical therapy, including strengthening and stretching exercises.  Taking NSAIDs, such as ibuprofen, to help relieve pain and swelling.  Using supportive shoes, wraps, heel lifts, or a walking boot (air cast).  Having surgery. This may be done if your symptoms do not improve after other treatments.  Using high-energy shock wave impulses to stimulate the healing process (extracorporeal shock wave therapy). This is rare.  Having an injection of medicines that help relieve inflammation (corticosteroids). This is rare. Follow these instructions at home: If you have an air cast:  Wear the air cast as told by your health care provider. Remove it only as told by your health care provider.  Loosen it if your toes tingle, become numb, or turn cold and blue.  Keep it clean.  If the air cast is not waterproof: ? Do not let it get wet. ? Cover it with a watertight covering when you take a bath or shower. Managing pain, stiffness, and swelling   If directed, put ice on the injured area. To do this: ? If you have a removable air cast, remove it as told by your health care provider. ? Put ice in a plastic bag. ? Place a towel between your skin and the bag. ? Leave the ice on for 20 minutes, 2-3 times a day.  Move your toes often to reduce stiffness and swelling.  Raise (elevate) your foot above the level of your heart while you are sitting or lying down. Activity  Gradually return to your normal activities as told by your health care provider. Ask your health care provider what activities are safe for you.  Do not do   activities that cause pain.  Consider doing low-impact exercises, like cycling or swimming.  Ask your health care provider when it is safe to drive if you have an air cast on your foot.  If physical therapy was prescribed, do exercises as told by your health care provider or physical therapist. General instructions  If directed, wrap your foot with an  elastic bandage or other wrap. This can help to keep your tendon from moving too much while it heals. Your health care provider will show you how to wrap your foot correctly.  Wear supportive shoes or heel lifts only as told by your health care provider.  Take over-the-counter and prescription medicines only as told by your health care provider.  Keep all follow-up visits as told by your health care provider. This is important. Contact a health care provider if you:  Have symptoms that get worse.  Have pain that does not get better with medicine.  Develop new, unexplained symptoms.  Develop warmth and swelling in your foot.  Have a fever. Get help right away if you:  Have a sudden popping sound or sensation in your Achilles tendon followed by severe pain.  Cannot move your toes or foot.  Cannot put any weight on your foot.  Your foot or toes become numb and look white or blue even after loosening your bandage or air cast. Summary  Achilles tendinitis is inflammation of the tough, cord-like band that attaches the lower leg muscles to the heel bone (Achilles tendon).  This condition is usually caused by overusing the tendon and the ankle joint. It can also be caused by arthritis or normal aging.  The most common symptoms of this condition include pain, swelling, or stiffness in the Achilles tendon or in the back of the leg.  This condition is usually treated by decreasing or stopping activities that caused the tendinitis, icing the injured area, taking NSAIDs, and doing physical therapy. This information is not intended to replace advice given to you by your health care provider. Make sure you discuss any questions you have with your health care provider. Document Revised: 07/08/2018 Document Reviewed: 07/08/2018 Elsevier Patient Education  2020 [employer].  

## 2019-10-02 NOTE — Progress Notes (Signed)
BP 118/81 (BP Location: Left Arm, Patient Position: Sitting, Cuff Size: Large)   Pulse 83   Temp 97.8 F (36.6 C) (Oral)   Wt (!) 218 lb 3.2 oz (99 kg)   LMP 09/23/2019   SpO2 98%   BMI 41.23 kg/m    Subjective:    Patient ID: Evelyn Webster, female    DOB: 11-Mar-1980, 39 y.o.   MRN: [id number]  HPI: Evelyn Webster is a 39 y.o. female  Chief Complaint  Patient presents with  . Foot Pain  . skin peeling    finger   FOOT PAIN Duration: few days Involved foot: right Mechanism of injury: unknown Location: posterior ankle and achilles tendon Onset: sudden  Severity: severe  Quality:  Aching and sore Frequency: constant Radiation: no Aggravating factors: weight bearing, walking and running  Alleviating factors:nothing   Status: worse Treatments attempted:naproxen   Relief with NSAIDs?:  no Weakness with weight bearing or walking: yes Morning stiffness: no Swelling: yes Redness: no Bruising: no Paresthesias / decreased sensation: no  Fevers:no   Has also had swelling and skin peeling on her R middle finger. Got acrylic nails, but is not sure if that's what started it.   Relevant past medical, surgical, family and social history reviewed and updated as indicated. Interim medical history since our last visit reviewed. Allergies and medications reviewed and updated.  Review of Systems  Constitutional: Negative.   Respiratory: Negative.   Cardiovascular: Negative.   Gastrointestinal: Negative.   Musculoskeletal: Positive for arthralgias and myalgias. Negative for back pain, gait problem, joint swelling, neck pain and neck stiffness.  Skin: Positive for rash. Negative for color change, pallor and wound.  Neurological: Negative.   Psychiatric/Behavioral: Negative.     Per HPI unless specifically indicated above     Objective:    BP 118/81 (BP Location: Left Arm, Patient Position: Sitting, Cuff Size: Large)   Pulse 83   Temp 97.8 F (36.6 C) (Oral)   Wt (!)  218 lb 3.2 oz (99 kg)   LMP 09/23/2019   SpO2 98%   BMI 41.23 kg/m   Wt Readings from Last 3 Encounters:  10/02/19 (!) 218 lb 3.2 oz (99 kg)  09/25/19 (!) 220 lb (99.8 kg)  08/19/19 230 lb (104.3 kg)    Physical Exam Vitals and nursing note reviewed.  Constitutional:      General: She is not in acute distress.    Appearance: Normal appearance. She is not ill-appearing, toxic-appearing or diaphoretic.  HENT:     Head: Normocephalic and atraumatic.     Right Ear: External ear normal.     Left Ear: External ear normal.     Nose: Nose normal.     Mouth/Throat:     Mouth: Mucous membranes are moist.     Pharynx: Oropharynx is clear.  Eyes:     General: No scleral icterus.       Right eye: No discharge.        Left eye: No discharge.     Extraocular Movements: Extraocular movements intact.     Conjunctiva/sclera: Conjunctivae normal.     Pupils: Pupils are equal, round, and reactive to light.  Cardiovascular:     Rate and Rhythm: Normal rate and regular rhythm.     Pulses: Normal pulses.     Heart sounds: Normal heart sounds. No murmur heard.  No friction rub. No gallop.   Pulmonary:     Effort: Pulmonary effort is normal. No respiratory distress.  Breath sounds: Normal breath sounds. No stridor. No wheezing, rhonchi or rales.  Chest:     Chest wall: No tenderness.  Musculoskeletal:        General: Tenderness (tenderness and swelling to R achilles tendon) present. Normal range of motion.     Cervical back: Normal range of motion and neck supple.  Skin:    General: Skin is warm and dry.     Capillary Refill: Capillary refill takes less than 2 seconds.     Coloration: Skin is not jaundiced or pale.     Findings: No bruising, erythema, lesion or rash.     Comments: Mild swelling and skin peeling on R middle finger around nail bed  Neurological:     General: No focal deficit present.     Mental Status: She is alert and oriented to person, place, and time. Mental status is  at baseline.  Psychiatric:        Mood and Affect: Mood normal.        Behavior: Behavior normal.        Thought Content: Thought content normal.        Judgment: Judgment normal.     Results for orders placed or performed in visit on 07/28/19  Novel Coronavirus, NAA ([facility])   Specimen: Nasopharyngeal(NP) swabs in vial transport medium   NASOPHARYNGE  TESTING  Result Value Ref Range   SARS-CoV-2, NAA Not Detected Not Detected  SARS-COV-2, NAA 2 DAY TAT   NASOPHARYNGE  TESTING  Result Value Ref Range   SARS-CoV-2, NAA 2 DAY TAT Performed       Assessment & Plan:   Problem List Items Addressed This Visit    None    Visit Diagnoses    Achilles tendinitis of right lower extremity    -  Primary   Will treat with diclofenac and stretches. If not getting better or getting worse in 2 weeks, will get her x-ray and ?podiatry.   Relevant Medications   ketorolac (TORADOL) injection 60 mg   Irritant contact dermatitis due to cosmetics       Will treat with bactroban and triamcinalone. Call if not getting better or getting worse.        Follow up plan: Return in about 3 weeks (around 10/23/2019).

## 2019-10-18 ENCOUNTER — Other Ambulatory Visit: Payer: Self-pay | Admitting: Family Medicine

## 2019-10-21 ENCOUNTER — Ambulatory Visit: Payer: PRIVATE HEALTH INSURANCE | Admitting: Family Medicine

## 2019-12-15 ENCOUNTER — Ambulatory Visit: Payer: PRIVATE HEALTH INSURANCE | Admitting: Nurse Practitioner
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
## 2019-12-26 ENCOUNTER — Ambulatory Visit: Payer: Self-pay

## 2019-12-26 ENCOUNTER — Other Ambulatory Visit: Payer: Self-pay

## 2019-12-26 ENCOUNTER — Encounter: Payer: Self-pay | Admitting: Emergency Medicine

## 2019-12-26 ENCOUNTER — Emergency Department
Admission: EM | Admit: 2019-12-26 | Discharge: 2019-12-26 | Disposition: A | Discharge status: Home / Self Care | Payer: Medicaid Other | Attending: Emergency Medicine | Admitting: Emergency Medicine

## 2019-12-26 DIAGNOSIS — Z5321 Procedure and treatment not carried out due to patient leaving prior to being seen by health care provider: Secondary | ICD-10-CM | POA: Insufficient documentation

## 2019-12-26 DIAGNOSIS — L02412 Cutaneous abscess of left axilla: Secondary | ICD-10-CM | POA: Insufficient documentation

## 2019-12-26 NOTE — Telephone Encounter (Signed)
Reason for CRM: patient has a bump in left armpit, pain to touch and sore for the last 2 days. Patient PCP has no available appointments seeking clinical advice . Pt. Reports lump started 2 days ago. 1 inch in diameter. Pt. Will go to UC due to no availability in practice today.  Reason for Disposition  SEVERE pain (e.g., excruciating)  Answer Assessment - Initial Assessment Questions 1. APPEARANCE of SWELLING: "What does it look like?" (e.g., lymph node, insect bite, mole)     Lump 2. SIZE: "How large is the swelling?" (inches, cm or compare to coins)     1 inch 3. LOCATION: "Where is the swelling located?"     Left arm 4. ONSET: "When did the swelling start?"     2 days ago 5. PAIN: "Is it painful?" If Yes, ask: "How much?"     10 6. ITCH: "Does it itch?" If Yes, ask: "How much?"     No 7. CAUSE: "What do you think caused the swelling?"     Unsure 8. OTHER SYMPTOMS: "Do you have any other symptoms?" (e.g., fever)     No  Protocols used: SKIN LUMP OR LOCALIZED SWELLING-A-AH

## 2019-12-26 NOTE — ED Triage Notes (Signed)
Patient ambulatory to triage with steady gait, without difficulty or distress noted; pt reports abscess to left axillae

## 2019-12-26 NOTE — ED Notes (Signed)
Called x1

## 2019-12-26 NOTE — ED Notes (Signed)
Called phone number pt left; goes immediately to voicemail which is full.

## 2019-12-29 ENCOUNTER — Encounter: Payer: Self-pay | Admitting: Emergency Medicine

## 2019-12-29 ENCOUNTER — Other Ambulatory Visit: Payer: Self-pay

## 2019-12-29 ENCOUNTER — Emergency Department
Admission: EM | Admit: 2019-12-29 | Discharge: 2019-12-29 | Disposition: A | Discharge status: Home / Self Care | Payer: PRIVATE HEALTH INSURANCE | Attending: Emergency Medicine | Admitting: Emergency Medicine

## 2019-12-29 DIAGNOSIS — F17210 Nicotine dependence, cigarettes, uncomplicated: Secondary | ICD-10-CM | POA: Diagnosis not present

## 2019-12-29 DIAGNOSIS — J449 Chronic obstructive pulmonary disease, unspecified: Secondary | ICD-10-CM | POA: Insufficient documentation

## 2019-12-29 DIAGNOSIS — M79602 Pain in left arm: Secondary | ICD-10-CM | POA: Diagnosis present

## 2019-12-29 DIAGNOSIS — Z7951 Long term (current) use of inhaled steroids: Secondary | ICD-10-CM | POA: Insufficient documentation

## 2019-12-29 DIAGNOSIS — L02412 Cutaneous abscess of left axilla: Secondary | ICD-10-CM | POA: Insufficient documentation

## 2019-12-29 MED ORDER — ACETAMINOPHEN 325 MG PO TABS: 650.0000 mg | ORAL_TABLET | Freq: Once | ORAL | Status: DC | PRN

## 2019-12-29 MED ORDER — OXYCODONE-ACETAMINOPHEN 5-325 MG PO TABS
1.0000 | ORAL_TABLET | ORAL | 0 refills | Status: DC | PRN
Start: 2019-12-29 — End: 2020-09-01

## 2019-12-29 MED ORDER — LIDOCAINE-EPINEPHRINE 1 %-1:100000 IJ SOLN
20.0000 mL | Freq: Once | INTRAMUSCULAR | Status: AC
  Administered 2019-12-29: 20 mL
  Filled 2019-12-29: qty 20

## 2019-12-29 MED ORDER — OXYCODONE-ACETAMINOPHEN 5-325 MG PO TABS
1.0000 | ORAL_TABLET | Freq: Once | ORAL | Status: AC
  Administered 2019-12-29: 1 via ORAL
  Filled 2019-12-29: qty 1

## 2019-12-29 NOTE — ED Notes (Signed)
Patient verbalizes understanding of discharge instructions. Opportunity for questioning and answers were provided. Armband removed by staff, pt discharged from ED. Ambulated out to lobby  

## 2019-12-29 NOTE — Discharge Instructions (Addendum)
1.  Continue and finish antibiotic as previously prescribed. 2.  You may take Percocet as needed for pain. 3.  You do not need to remove the packing.  Have your PCP remove the packing on Wednesday at your appointment. 4.  Return to the ER for worsening symptoms, fever, persistent vomiting or other concerns.

## 2019-12-29 NOTE — ED Triage Notes (Signed)
Patient states that she has an abscess under her left arm that started on Wednesday. Patient states that she went to urgent care on Friday and started antibiotics. Patient states that the abscess is becoming work.
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
## 2019-12-31 ENCOUNTER — Ambulatory Visit (INDEPENDENT_AMBULATORY_CARE_PROVIDER_SITE_OTHER): Payer: PRIVATE HEALTH INSURANCE | Admitting: Nurse Practitioner

## 2019-12-31 ENCOUNTER — Other Ambulatory Visit: Payer: Self-pay

## 2019-12-31 ENCOUNTER — Encounter: Payer: Self-pay | Admitting: Nurse Practitioner

## 2019-12-31 DIAGNOSIS — L0291 Cutaneous abscess, unspecified: Secondary | ICD-10-CM | POA: Diagnosis not present

## 2019-12-31 NOTE — Patient Instructions (Signed)
Wound Care, Adult Taking care of your wound properly can help to prevent pain, infection, and scarring. It can also help your wound to heal more quickly. How to care for your wound Wound care      Follow instructions from your health care provider about how to take care of your wound. Make sure you: ? Wash your hands with soap and water before you change the bandage (dressing). If soap and water are not available, use hand sanitizer. ? Change your dressing as told by your health care provider. ? Leave stitches (sutures), skin glue, or adhesive strips in place. These skin closures may need to stay in place for 2 weeks or longer. If adhesive strip edges start to loosen and curl up, you may trim the loose edges. Do not remove adhesive strips completely unless your health care provider tells you to do that.  Check your wound area every day for signs of infection. Check for: ? Redness, swelling, or pain. ? Fluid or blood. ? Warmth. ? Pus or a bad smell.  Ask your health care provider if you should clean the wound with mild soap and water. Doing this may include: ? Using a clean towel to pat the wound dry after cleaning it. Do not rub or scrub the wound. ? Applying a cream or ointment. Do this only as told by your health care provider. ? Covering the incision with a clean dressing.  Ask your health care provider when you can leave the wound uncovered.  Keep the dressing dry until your health care provider says it can be removed. Do not take baths, swim, use a hot tub, or do anything that would put the wound underwater until your health care provider approves. Ask your health care provider if you can take showers. You may only be allowed to take sponge baths. Medicines   If you were prescribed an antibiotic medicine, cream, or ointment, take or use the antibiotic as told by your health care provider. Do not stop taking or using the antibiotic even if your condition improves.  Take  over-the-counter and prescription medicines only as told by your health care provider. If you were prescribed pain medicine, take it 30 or more minutes before you do any wound care or as told by your health care provider. General instructions  Return to your normal activities as told by your health care provider. Ask your health care provider what activities are safe.  Do not scratch or pick at the wound.  Do not use any products that contain nicotine or tobacco, such as cigarettes and e-cigarettes. These may delay wound healing. If you need help quitting, ask your health care provider.  Keep all follow-up visits as told by your health care provider. This is important.  Eat a diet that includes protein, vitamin A, vitamin C, and other nutrient-rich foods to help the wound heal. ? Foods rich in protein include meat, dairy, beans, nuts, and other sources. ? Foods rich in vitamin A include carrots and dark green, leafy vegetables. ? Foods rich in vitamin C include citrus, tomatoes, and other fruits and vegetables. ? Nutrient-rich foods have protein, carbohydrates, fat, vitamins, or minerals. Eat a variety of healthy foods including vegetables, fruits, and whole grains. Contact a health care provider if:  You received a tetanus shot and you have swelling, severe pain, redness, or bleeding at the injection site.  Your pain is not controlled with medicine.  You have redness, swelling, or pain around the wound.    You have fluid or blood coming from the wound.  Your wound feels warm to the touch.  You have pus or a bad smell coming from the wound.  You have a fever or chills.  You are nauseous or you vomit.  You are dizzy. Get help right away if:  You have a red streak going away from your wound.  The edges of the wound open up and separate.  Your wound is bleeding, and the bleeding does not stop with gentle pressure.  You have a rash.  You faint.  You have trouble  breathing. Summary  Always wash your hands with soap and water before changing your bandage (dressing).  To help with healing, eat foods that are rich in protein, vitamin A, vitamin C, and other nutrients.  Check your wound every day for signs of infection. Contact your health care provider if you suspect that your wound is infected. This information is not intended to replace advice given to you by your health care provider. Make sure you discuss any questions you have with your health care provider. Document Revised: 06/10/2018 Document Reviewed: 09/07/2015 Elsevier Patient Education  2020 [employer]  

## 2019-12-31 NOTE — Assessment & Plan Note (Signed)
Under left axilla, healing.  No s/s infection present.  Continue to completion abx therapy and use remaining pain medication as needed, recommend alternating this with plain Tylenol as needed.  New packing placed, suspect next visit will be able to remove and allow to heal on own.  Return in 2 days for wound check.

## 2019-12-31 NOTE — Progress Notes (Signed)
BP 114/78 (BP Location: Left Arm, Patient Position: Sitting, Cuff Size: Normal)   Pulse 93   Temp 97.6 F (36.4 C) (Oral)   Resp 16   Wt 215 lb (97.5 kg)   LMP 12/27/2019 (Exact Date)   SpO2 98%   BMI 40.62 kg/m    Subjective:    Patient ID: Evelyn Webster, female    DOB: May 21, 1980, 39 y.o.   MRN: [id number]  HPI: Evelyn Webster is a 39 y.o. female  Chief Complaint  Patient presents with  . Follow-up    [facility] ER on 12/29/2019. Abscess of left axilla.   ER FOLLOW UP Seen in ER on 12/29/19 for abscess under left axilla.  3 days prior to ER visit had been in UC and on Bactrim.  Then in ER had area I&D.  When it was drained moderate amount came out.  She reports pain continues to area, about 5/10 at this time.  Has a few of the pain medication left provided in ER.  Denies fever. Given Norco in ER for 15 tablets. Time since discharge: 2 days Hospital/facility: [facility] Diagnosis: abscess left axilla Procedures/tests: I&D Consultants: none New medications: Norco Discharge instructions:  Follow-up with PCP Status: stable  Relevant past medical, surgical, family and social history reviewed and updated as indicated. Interim medical history since our last visit reviewed. Allergies and medications reviewed and updated.  Review of Systems  Constitutional: Negative.   Respiratory: Negative.   Cardiovascular: Negative.   Skin: Positive for wound.  Neurological: Negative.     Per HPI unless specifically indicated above     Objective:    BP 114/78 (BP Location: Left Arm, Patient Position: Sitting, Cuff Size: Normal)   Pulse 93   Temp 97.6 F (36.4 C) (Oral)   Resp 16   Wt 215 lb (97.5 kg)   LMP 12/27/2019 (Exact Date)   SpO2 98%   BMI 40.62 kg/m   Wt Readings from Last 3 Encounters:  12/31/19 215 lb (97.5 kg)  12/29/19 230 lb (104.3 kg)  12/26/19 230 lb (104.3 kg)    Physical Exam Vitals and nursing note reviewed.  Constitutional:      General: She is awake. She is  not in acute distress.    Appearance: She is well-developed and well-groomed. She is obese. She is not ill-appearing.  HENT:     Head: Normocephalic.     Right Ear: Hearing normal.     Left Ear: Hearing normal.  Eyes:     General: Lids are normal.        Right eye: No discharge.        Left eye: No discharge.     Conjunctiva/sclera: Conjunctivae normal.     Pupils: Pupils are equal, round, and reactive to light.  Cardiovascular:     Rate and Rhythm: Normal rate and regular rhythm.     Heart sounds: Normal heart sounds. No murmur heard.  No gallop.   Pulmonary:     Effort: Pulmonary effort is normal. No accessory muscle usage or respiratory distress.     Breath sounds: Normal breath sounds.  Abdominal:     General: Bowel sounds are normal.     Palpations: Abdomen is soft.  Musculoskeletal:     Cervical back: Normal range of motion and neck supple.     Right lower leg: No edema.     Left lower leg: No edema.  Skin:    General: Skin is warm and dry.     Findings:  Wound present.     Comments: Approx 1.5 cm open wound under left axilla with packing present.  Small amount yellowish drainage on old dressing and packing + scant sang.  Mild erythema around exterior wound with no warmth or edema.  Slight tenderness with palpation, but no further drainage.  Area repacked and nonadherent dressing placed over top.  Neurological:     Mental Status: She is alert and oriented to person, place, and time.  Psychiatric:        Attention and Perception: Attention normal.        Mood and Affect: Mood normal.        Speech: Speech normal.        Behavior: Behavior normal. Behavior is cooperative.        Thought Content: Thought content normal.     Results for orders placed or performed in visit on 07/28/19  Novel Coronavirus, NAA ([facility])   Specimen: Nasopharyngeal(NP) swabs in vial transport medium   NASOPHARYNGE  TESTING  Result Value Ref Range   SARS-CoV-2, NAA Not Detected Not Detected    SARS-COV-2, NAA 2 DAY TAT   NASOPHARYNGE  TESTING  Result Value Ref Range   SARS-CoV-2, NAA 2 DAY TAT Performed       Assessment & Plan:   Problem List Items Addressed This Visit      Other   Abscess    Under left axilla, healing.  No s/s infection present.  Continue to completion abx therapy and use remaining pain medication as needed, recommend alternating this with plain Tylenol as needed.  New packing placed, suspect next visit will be able to remove and allow to heal on own.  Return in 2 days for wound check.          Follow up plan: Return in about 2 days (around 01/02/2020) for Friday or Monday for wound check with Dr. Katheren Shams, or me.

## 2020-01-02 ENCOUNTER — Ambulatory Visit (INDEPENDENT_AMBULATORY_CARE_PROVIDER_SITE_OTHER): Payer: PRIVATE HEALTH INSURANCE | Admitting: Nurse Practitioner

## 2020-01-02 ENCOUNTER — Encounter: Payer: Self-pay | Admitting: Nurse Practitioner

## 2020-01-02 ENCOUNTER — Other Ambulatory Visit: Payer: Self-pay

## 2020-01-02 VITALS — BP 112/81 | HR 91 | Temp 97.8°F | Resp 16 | Wt 218.4 lb

## 2020-01-02 DIAGNOSIS — L0291 Cutaneous abscess, unspecified: Secondary | ICD-10-CM | POA: Diagnosis not present

## 2020-01-02 NOTE — Assessment & Plan Note (Signed)
Acute, ongoing.  No signs or symptoms of infection today.  Instructed to continue antibiotic until completion.  He is remaining pain medication along with Tylenol alternating for pain relief.  New packing placed today-hopefully at next visit will be able to remove and allow healing on.  Return in 3 days for wound check.

## 2020-01-02 NOTE — Patient Instructions (Signed)
Wound Care, Adult Taking care of your wound properly can help to prevent pain, infection, and scarring. It can also help your wound to heal more quickly. How to care for your wound Wound care      Follow instructions from your health care provider about how to take care of your wound. Make sure you: ? Wash your hands with soap and water before you change the bandage (dressing). If soap and water are not available, use hand sanitizer. ? Change your dressing as told by your health care provider. ? Leave stitches (sutures), skin glue, or adhesive strips in place. These skin closures may need to stay in place for 2 weeks or longer. If adhesive strip edges start to loosen and curl up, you may trim the loose edges. Do not remove adhesive strips completely unless your health care provider tells you to do that.  Check your wound area every day for signs of infection. Check for: ? Redness, swelling, or pain. ? Fluid or blood. ? Warmth. ? Pus or a bad smell.  Ask your health care provider if you should clean the wound with mild soap and water. Doing this may include: ? Using a clean towel to pat the wound dry after cleaning it. Do not rub or scrub the wound. ? Applying a cream or ointment. Do this only as told by your health care provider. ? Covering the incision with a clean dressing.  Ask your health care provider when you can leave the wound uncovered.  Keep the dressing dry until your health care provider says it can be removed. Do not take baths, swim, use a hot tub, or do anything that would put the wound underwater until your health care provider approves. Ask your health care provider if you can take showers. You may only be allowed to take sponge baths. Medicines   If you were prescribed an antibiotic medicine, cream, or ointment, take or use the antibiotic as told by your health care provider. Do not stop taking or using the antibiotic even if your condition improves.  Take  over-the-counter and prescription medicines only as told by your health care provider. If you were prescribed pain medicine, take it 30 or more minutes before you do any wound care or as told by your health care provider. General instructions  Return to your normal activities as told by your health care provider. Ask your health care provider what activities are safe.  Do not scratch or pick at the wound.  Do not use any products that contain nicotine or tobacco, such as cigarettes and e-cigarettes. These may delay wound healing. If you need help quitting, ask your health care provider.  Keep all follow-up visits as told by your health care provider. This is important.  Eat a diet that includes protein, vitamin A, vitamin C, and other nutrient-rich foods to help the wound heal. ? Foods rich in protein include meat, dairy, beans, nuts, and other sources. ? Foods rich in vitamin A include carrots and dark green, leafy vegetables. ? Foods rich in vitamin C include citrus, tomatoes, and other fruits and vegetables. ? Nutrient-rich foods have protein, carbohydrates, fat, vitamins, or minerals. Eat a variety of healthy foods including vegetables, fruits, and whole grains. Contact a health care provider if:  You received a tetanus shot and you have swelling, severe pain, redness, or bleeding at the injection site.  Your pain is not controlled with medicine.  You have redness, swelling, or pain around the wound.    You have fluid or blood coming from the wound.  Your wound feels warm to the touch.  You have pus or a bad smell coming from the wound.  You have a fever or chills.  You are nauseous or you vomit.  You are dizzy. Get help right away if:  You have a red streak going away from your wound.  The edges of the wound open up and separate.  Your wound is bleeding, and the bleeding does not stop with gentle pressure.  You have a rash.  You faint.  You have trouble  breathing. Summary  Always wash your hands with soap and water before changing your bandage (dressing).  To help with healing, eat foods that are rich in protein, vitamin A, vitamin C, and other nutrients.  Check your wound every day for signs of infection. Contact your health care provider if you suspect that your wound is infected. This information is not intended to replace advice given to you by your health care provider. Make sure you discuss any questions you have with your health care provider. Document Revised: 06/10/2018 Document Reviewed: 09/07/2015 Elsevier Patient Education  2020 [employer]  
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
## 2020-01-05 ENCOUNTER — Encounter: Payer: Self-pay | Admitting: Nurse Practitioner

## 2020-01-05 ENCOUNTER — Other Ambulatory Visit: Payer: Self-pay

## 2020-01-05 ENCOUNTER — Ambulatory Visit (INDEPENDENT_AMBULATORY_CARE_PROVIDER_SITE_OTHER): Payer: PRIVATE HEALTH INSURANCE | Admitting: Nurse Practitioner

## 2020-01-05 VITALS — BP 110/78 | HR 84 | Temp 98.2°F | Ht 61.0 in | Wt 216.0 lb

## 2020-01-05 DIAGNOSIS — L0291 Cutaneous abscess, unspecified: Secondary | ICD-10-CM | POA: Diagnosis not present

## 2020-01-05 DIAGNOSIS — Z23 Encounter for immunization: Secondary | ICD-10-CM | POA: Diagnosis not present

## 2020-01-05 NOTE — Assessment & Plan Note (Addendum)
Acute, ongoing.  No signs or symptoms of infection today.  Encouraged patient to take antibiotic twice daily for the next 2 days and follow-up in 2-3 days.  Continue current regimen for pain relief.  New packing replaced today-should be able to remove it next visit and allow healing on own.  Note given for work.  Return in 2-3 days for wound check.
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
## 2020-01-05 NOTE — Patient Instructions (Signed)
Wound Care, Adult Taking care of your wound properly can help to prevent pain, infection, and scarring. It can also help your wound to heal more quickly. How to care for your wound Wound care      Follow instructions from your health care provider about how to take care of your wound. Make sure you: ? Wash your hands with soap and water before you change the bandage (dressing). If soap and water are not available, use hand sanitizer. ? Change your dressing as told by your health care provider. ? Leave stitches (sutures), skin glue, or adhesive strips in place. These skin closures may need to stay in place for 2 weeks or longer. If adhesive strip edges start to loosen and curl up, you may trim the loose edges. Do not remove adhesive strips completely unless your health care provider tells you to do that.  Check your wound area every day for signs of infection. Check for: ? Redness, swelling, or pain. ? Fluid or blood. ? Warmth. ? Pus or a bad smell.  Ask your health care provider if you should clean the wound with mild soap and water. Doing this may include: ? Using a clean towel to pat the wound dry after cleaning it. Do not rub or scrub the wound. ? Applying a cream or ointment. Do this only as told by your health care provider. ? Covering the incision with a clean dressing.  Ask your health care provider when you can leave the wound uncovered.  Keep the dressing dry until your health care provider says it can be removed. Do not take baths, swim, use a hot tub, or do anything that would put the wound underwater until your health care provider approves. Ask your health care provider if you can take showers. You may only be allowed to take sponge baths. Medicines   If you were prescribed an antibiotic medicine, cream, or ointment, take or use the antibiotic as told by your health care provider. Do not stop taking or using the antibiotic even if your condition improves.  Take  over-the-counter and prescription medicines only as told by your health care provider. If you were prescribed pain medicine, take it 30 or more minutes before you do any wound care or as told by your health care provider. General instructions  Return to your normal activities as told by your health care provider. Ask your health care provider what activities are safe.  Do not scratch or pick at the wound.  Do not use any products that contain nicotine or tobacco, such as cigarettes and e-cigarettes. These may delay wound healing. If you need help quitting, ask your health care provider.  Keep all follow-up visits as told by your health care provider. This is important.  Eat a diet that includes protein, vitamin A, vitamin C, and other nutrient-rich foods to help the wound heal. ? Foods rich in protein include meat, dairy, beans, nuts, and other sources. ? Foods rich in vitamin A include carrots and dark green, leafy vegetables. ? Foods rich in vitamin C include citrus, tomatoes, and other fruits and vegetables. ? Nutrient-rich foods have protein, carbohydrates, fat, vitamins, or minerals. Eat a variety of healthy foods including vegetables, fruits, and whole grains. Contact a health care provider if:  You received a tetanus shot and you have swelling, severe pain, redness, or bleeding at the injection site.  Your pain is not controlled with medicine.  You have redness, swelling, or pain around the wound.    You have fluid or blood coming from the wound.  Your wound feels warm to the touch.  You have pus or a bad smell coming from the wound.  You have a fever or chills.  You are nauseous or you vomit.  You are dizzy. Get help right away if:  You have a red streak going away from your wound.  The edges of the wound open up and separate.  Your wound is bleeding, and the bleeding does not stop with gentle pressure.  You have a rash.  You faint.  You have trouble  breathing. Summary  Always wash your hands with soap and water before changing your bandage (dressing).  To help with healing, eat foods that are rich in protein, vitamin A, vitamin C, and other nutrients.  Check your wound every day for signs of infection. Contact your health care provider if you suspect that your wound is infected. This information is not intended to replace advice given to you by your health care provider. Make sure you discuss any questions you have with your health care provider. Document Revised: 06/10/2018 Document Reviewed: 09/07/2015 Elsevier Patient Education  2020 [employer]  

## 2020-01-07 ENCOUNTER — Encounter: Payer: Self-pay | Admitting: Nurse Practitioner

## 2020-01-07 ENCOUNTER — Other Ambulatory Visit: Payer: Self-pay

## 2020-01-07 ENCOUNTER — Ambulatory Visit (INDEPENDENT_AMBULATORY_CARE_PROVIDER_SITE_OTHER): Payer: PRIVATE HEALTH INSURANCE | Admitting: Nurse Practitioner

## 2020-01-07 VITALS — BP 106/75 | HR 94 | Temp 97.8°F | Ht 61.0 in | Wt 216.0 lb

## 2020-01-07 DIAGNOSIS — L0291 Cutaneous abscess, unspecified: Secondary | ICD-10-CM | POA: Diagnosis not present

## 2020-01-07 NOTE — Assessment & Plan Note (Signed)
Under left axilla, healing -- much improved and smaller in size.  No s/s infection present.  Continue to use remaining pain medication as needed, recommend alternating this with plain Tylenol as needed.  Applied steri strips and will allow area to heal naturally at this time, no further packing.  Will not extend abx at this time, but have recommended patient reach out to provider if any increase in drainage, warmth, or pain -- then may extend course.  Return in 1 week for wound check.

## 2020-01-07 NOTE — Patient Instructions (Signed)
Wound Care, Adult Taking care of your wound properly can help to prevent pain, infection, and scarring. It can also help your wound to heal more quickly. How to care for your wound Wound care      Follow instructions from your health care provider about how to take care of your wound. Make sure you: ? Wash your hands with soap and water before you change the bandage (dressing). If soap and water are not available, use hand sanitizer. ? Change your dressing as told by your health care provider. ? Leave stitches (sutures), skin glue, or adhesive strips in place. These skin closures may need to stay in place for 2 weeks or longer. If adhesive strip edges start to loosen and curl up, you may trim the loose edges. Do not remove adhesive strips completely unless your health care provider tells you to do that.  Check your wound area every day for signs of infection. Check for: ? Redness, swelling, or pain. ? Fluid or blood. ? Warmth. ? Pus or a bad smell.  Ask your health care provider if you should clean the wound with mild soap and water. Doing this may include: ? Using a clean towel to pat the wound dry after cleaning it. Do not rub or scrub the wound. ? Applying a cream or ointment. Do this only as told by your health care provider. ? Covering the incision with a clean dressing.  Ask your health care provider when you can leave the wound uncovered.  Keep the dressing dry until your health care provider says it can be removed. Do not take baths, swim, use a hot tub, or do anything that would put the wound underwater until your health care provider approves. Ask your health care provider if you can take showers. You may only be allowed to take sponge baths. Medicines   If you were prescribed an antibiotic medicine, cream, or ointment, take or use the antibiotic as told by your health care provider. Do not stop taking or using the antibiotic even if your condition improves.  Take  over-the-counter and prescription medicines only as told by your health care provider. If you were prescribed pain medicine, take it 30 or more minutes before you do any wound care or as told by your health care provider. General instructions  Return to your normal activities as told by your health care provider. Ask your health care provider what activities are safe.  Do not scratch or pick at the wound.  Do not use any products that contain nicotine or tobacco, such as cigarettes and e-cigarettes. These may delay wound healing. If you need help quitting, ask your health care provider.  Keep all follow-up visits as told by your health care provider. This is important.  Eat a diet that includes protein, vitamin A, vitamin C, and other nutrient-rich foods to help the wound heal. ? Foods rich in protein include meat, dairy, beans, nuts, and other sources. ? Foods rich in vitamin A include carrots and dark green, leafy vegetables. ? Foods rich in vitamin C include citrus, tomatoes, and other fruits and vegetables. ? Nutrient-rich foods have protein, carbohydrates, fat, vitamins, or minerals. Eat a variety of healthy foods including vegetables, fruits, and whole grains. Contact a health care provider if:  You received a tetanus shot and you have swelling, severe pain, redness, or bleeding at the injection site.  Your pain is not controlled with medicine.  You have redness, swelling, or pain around the wound.    You have fluid or blood coming from the wound.  Your wound feels warm to the touch.  You have pus or a bad smell coming from the wound.  You have a fever or chills.  You are nauseous or you vomit.  You are dizzy. Get help right away if:  You have a red streak going away from your wound.  The edges of the wound open up and separate.  Your wound is bleeding, and the bleeding does not stop with gentle pressure.  You have a rash.  You faint.  You have trouble  breathing. Summary  Always wash your hands with soap and water before changing your bandage (dressing).  To help with healing, eat foods that are rich in protein, vitamin A, vitamin C, and other nutrients.  Check your wound every day for signs of infection. Contact your health care provider if you suspect that your wound is infected. This information is not intended to replace advice given to you by your health care provider. Make sure you discuss any questions you have with your health care provider. Document Revised: 06/10/2018 Document Reviewed: 09/07/2015 Elsevier Patient Education  2020 [employer]  

## 2020-01-07 NOTE — Progress Notes (Signed)
BP 106/75   Pulse 94   Temp 97.8 F (36.6 C) (Oral)   Ht 5\' 1"  (1.549 m)   Wt 216 lb (98 kg)   LMP 12/27/2019 (Exact Date)   SpO2 96%   BMI 40.81 kg/m    Subjective:    Patient ID: 12/29/2019, female    DOB: April 19, 1980, 39 y.o.   MRN: [id number]  HPI: Evelyn Webster is a 39 y.o. female  Chief Complaint  Patient presents with  . Wound Check    Still painful and draining   ABSCESS Presents for follow-up abscess under left axilla, last seen in office 01/05/20.  Seen in ER on 12/29/19 for abscess under left axilla.  3 days prior to ER visit had been in UC and on Bactrim -- was only taking once a day and instructed recent visit she is to take twice a day -- completed this course yesterday.  Then in ER had area I&D.  When it was drained moderate amount came out.  She reports pain continues to area, about 5/10 at this time.  Denies fever. Given Norco in ER for 15 tablets -- no further fills since 12/29/19.  Currently taking Tylenol and Ibuprofen -- offers benefit.  Has a few Norco left if needed.  She reports small amount drainage yesterday to wound -- no color or odor.   Duration: weeks Location: left axilla History of trauma in area: no Pain: yes Quality: yes Severity: 5/10 Redness: no Swelling: no Oozing: yes Pus: no Fevers: no Nausea/vomiting: no Status: stable Treatments attempted:antibiotics   Relevant past medical, surgical, family and social history reviewed and updated as indicated. Interim medical history since our last visit reviewed. Allergies and medications reviewed and updated.  Review of Systems  Constitutional: Negative.   Respiratory: Negative.   Cardiovascular: Negative.   Skin: Positive for wound.  Neurological: Negative.     Per HPI unless specifically indicated above     Objective:    BP 106/75   Pulse 94   Temp 97.8 F (36.6 C) (Oral)   Ht 5\' 1"  (1.549 m)   Wt 216 lb (98 kg)   LMP 12/27/2019 (Exact Date)   SpO2 96%   BMI 40.81  kg/m   Wt Readings from Last 3 Encounters:  01/07/20 216 lb (98 kg)  01/05/20 216 lb (98 kg)  01/02/20 218 lb 6.4 oz (99.1 kg)    Physical Exam Vitals and nursing note reviewed.  Constitutional:      General: She is awake. She is not in acute distress.    Appearance: She is well-developed and well-groomed. She is obese. She is not ill-appearing.  HENT:     Head: Normocephalic.     Right Ear: Hearing normal.     Left Ear: Hearing normal.  Eyes:     General: Lids are normal.        Right eye: No discharge.        Left eye: No discharge.     Conjunctiva/sclera: Conjunctivae normal.     Pupils: Pupils are equal, round, and reactive to light.  Cardiovascular:     Rate and Rhythm: Normal rate and regular rhythm.     Heart sounds: Normal heart sounds. No murmur heard.  No gallop.   Pulmonary:     Effort: Pulmonary effort is normal. No accessory muscle usage or respiratory distress.     Breath sounds: Normal breath sounds.  Abdominal:     General: Bowel sounds are normal.  Palpations: Abdomen is soft.  Musculoskeletal:     Cervical back: Normal range of motion and neck supple.     Right lower leg: No edema.     Left lower leg: No edema.  Skin:    General: Skin is warm and dry.     Findings: Wound present.     Comments: Approx 1.5 cm open wound under left axilla with packing present.  Scant serous drainage present from wound and scant on dressing.  Mild erythema around exterior wound with no warmth or edema -- much improved since last assessment.  No tenderness to palpation.  Wound bed smaller in depth, approx 1/2 cm.  At this time area cleansed and steri strips applied to allow for natural healing.  Dressing non adherent applied over top.  Neurological:     Mental Status: She is alert and oriented to person, place, and time.  Psychiatric:        Attention and Perception: Attention normal.        Mood and Affect: Mood normal.        Speech: Speech normal.        Behavior:  Behavior normal. Behavior is cooperative.        Thought Content: Thought content normal.     Results for orders placed or performed in visit on 07/28/19  Novel Coronavirus, NAA ([facility])   Specimen: Nasopharyngeal(NP) swabs in vial transport medium   NASOPHARYNGE  TESTING  Result Value Ref Range   SARS-CoV-2, NAA Not Detected Not Detected  SARS-COV-2, NAA 2 DAY TAT   NASOPHARYNGE  TESTING  Result Value Ref Range   SARS-CoV-2, NAA 2 DAY TAT Performed       Assessment & Plan:   Problem List Items Addressed This Visit      Other   Abscess - Primary    Under left axilla, healing -- much improved and smaller in size.  No s/s infection present.  Continue to use remaining pain medication as needed, recommend alternating this with plain Tylenol as needed.  Applied steri strips and will allow area to heal naturally at this time, no further packing.  Will not extend abx at this time, but have recommended patient reach out to provider if any increase in drainage, warmth, or pain -- then may extend course.  Return in 1 week for wound check.          Follow up plan: Return in about 1 week (around 01/14/2020) for Wound check.

## 2020-01-12 ENCOUNTER — Ambulatory Visit (INDEPENDENT_AMBULATORY_CARE_PROVIDER_SITE_OTHER): Payer: PRIVATE HEALTH INSURANCE | Admitting: Nurse Practitioner

## 2020-01-12 ENCOUNTER — Other Ambulatory Visit: Payer: Self-pay

## 2020-01-12 ENCOUNTER — Encounter: Payer: Self-pay | Admitting: Nurse Practitioner

## 2020-01-12 DIAGNOSIS — L0291 Cutaneous abscess, unspecified: Secondary | ICD-10-CM | POA: Diagnosis not present

## 2020-01-12 MED ORDER — DOXYCYCLINE HYCLATE 100 MG PO TABS: 100.0000 mg | ORAL_TABLET | Freq: Two times a day (BID) | ORAL | 0 refills | Status: AC

## 2020-01-12 NOTE — Patient Instructions (Signed)
Wound Care, Adult Taking care of your wound properly can help to prevent pain, infection, and scarring. It can also help your wound to heal more quickly. How to care for your wound Wound care      Follow instructions from your health care provider about how to take care of your wound. Make sure you: ? Wash your hands with soap and water before you change the bandage (dressing). If soap and water are not available, use hand sanitizer. ? Change your dressing as told by your health care provider. ? Leave stitches (sutures), skin glue, or adhesive strips in place. These skin closures may need to stay in place for 2 weeks or longer. If adhesive strip edges start to loosen and curl up, you may trim the loose edges. Do not remove adhesive strips completely unless your health care provider tells you to do that.  Check your wound area every day for signs of infection. Check for: ? Redness, swelling, or pain. ? Fluid or blood. ? Warmth. ? Pus or a bad smell.  Ask your health care provider if you should clean the wound with mild soap and water. Doing this may include: ? Using a clean towel to pat the wound dry after cleaning it. Do not rub or scrub the wound. ? Applying a cream or ointment. Do this only as told by your health care provider. ? Covering the incision with a clean dressing.  Ask your health care provider when you can leave the wound uncovered.  Keep the dressing dry until your health care provider says it can be removed. Do not take baths, swim, use a hot tub, or do anything that would put the wound underwater until your health care provider approves. Ask your health care provider if you can take showers. You may only be allowed to take sponge baths. Medicines   If you were prescribed an antibiotic medicine, cream, or ointment, take or use the antibiotic as told by your health care provider. Do not stop taking or using the antibiotic even if your condition improves.  Take  over-the-counter and prescription medicines only as told by your health care provider. If you were prescribed pain medicine, take it 30 or more minutes before you do any wound care or as told by your health care provider. General instructions  Return to your normal activities as told by your health care provider. Ask your health care provider what activities are safe.  Do not scratch or pick at the wound.  Do not use any products that contain nicotine or tobacco, such as cigarettes and e-cigarettes. These may delay wound healing. If you need help quitting, ask your health care provider.  Keep all follow-up visits as told by your health care provider. This is important.  Eat a diet that includes protein, vitamin A, vitamin C, and other nutrient-rich foods to help the wound heal. ? Foods rich in protein include meat, dairy, beans, nuts, and other sources. ? Foods rich in vitamin A include carrots and dark green, leafy vegetables. ? Foods rich in vitamin C include citrus, tomatoes, and other fruits and vegetables. ? Nutrient-rich foods have protein, carbohydrates, fat, vitamins, or minerals. Eat a variety of healthy foods including vegetables, fruits, and whole grains. Contact a health care provider if:  You received a tetanus shot and you have swelling, severe pain, redness, or bleeding at the injection site.  Your pain is not controlled with medicine.  You have redness, swelling, or pain around the wound.    You have fluid or blood coming from the wound.  Your wound feels warm to the touch.  You have pus or a bad smell coming from the wound.  You have a fever or chills.  You are nauseous or you vomit.  You are dizzy. Get help right away if:  You have a red streak going away from your wound.  The edges of the wound open up and separate.  Your wound is bleeding, and the bleeding does not stop with gentle pressure.  You have a rash.  You faint.  You have trouble  breathing. Summary  Always wash your hands with soap and water before changing your bandage (dressing).  To help with healing, eat foods that are rich in protein, vitamin A, vitamin C, and other nutrients.  Check your wound every day for signs of infection. Contact your health care provider if you suspect that your wound is infected. This information is not intended to replace advice given to you by your health care provider. Make sure you discuss any questions you have with your health care provider. Document Revised: 06/10/2018 Document Reviewed: 09/07/2015 Elsevier Patient Education  2020 [employer]  

## 2020-01-12 NOTE — Progress Notes (Signed)
BP 107/73   Pulse 86 Comment: apical  Temp 98 F (36.7 C) (Oral)   Ht 5\' 1"  (1.549 m)   Wt 216 lb (98 kg)   LMP 12/27/2019 (Exact Date)   SpO2 98%   BMI 40.81 kg/m    Subjective:    Patient ID: 12/29/2019, female    DOB: 23-Oct-1980, 39 y.o.   MRN: [id number]  HPI: Evelyn Webster is a 39 y.o. female  Chief Complaint  Patient presents with  . Wound Check    Left axillae, painful and draining, red around wound from tape.    ABSCESS Presents for follow-up abscess under left axilla, last seen in office 01/05/20.  Seen in ER on 12/29/19 for abscess under left axilla.  3 days prior to ER visit had been in UC and on Bactrim -- was only taking once a day and instructed recent visit she is to take twice a day -- completed this course yesterday.  Then in ER had area I&D.  When it was drained moderate amount came out.  She reports pain continues to area, about 5/10 at this time.  Denies fever. Given Norco in ER for 15 tablets -- no further fills since 12/29/19.  Currently taking Tylenol and Ibuprofen -- offers benefit.    Last visit packing was removed and steri strips applied to allow for healing, as minimal drainage and healing were present. Duration: weeks Location: left axilla History of trauma in area: no Pain: yes Quality: yes Severity: 5/10 Redness: no Swelling: no Oozing: yes Pus: no Fevers: no Nausea/vomiting: no Status: stable Treatments attempted:antibiotics   Relevant past medical, surgical, family and social history reviewed and updated as indicated. Interim medical history since our last visit reviewed. Allergies and medications reviewed and updated.  Review of Systems  Constitutional: Negative.   Respiratory: Negative.   Cardiovascular: Negative.   Skin: Positive for wound.  Neurological: Negative.     Per HPI unless specifically indicated above     Objective:    BP 107/73   Pulse 86 Comment: apical  Temp 98 F (36.7 C) (Oral)   Ht 5\' 1"  (1.549  m)   Wt 216 lb (98 kg)   LMP 12/27/2019 (Exact Date)   SpO2 98%   BMI 40.81 kg/m   Wt Readings from Last 3 Encounters:  01/12/20 216 lb (98 kg)  01/07/20 216 lb (98 kg)  01/05/20 216 lb (98 kg)    Physical Exam Vitals and nursing note reviewed.  Constitutional:      General: She is awake. She is not in acute distress.    Appearance: She is well-developed and well-groomed. She is obese. She is not ill-appearing.  HENT:     Head: Normocephalic.     Right Ear: Hearing normal.     Left Ear: Hearing normal.  Eyes:     General: Lids are normal.        Right eye: No discharge.        Left eye: No discharge.     Conjunctiva/sclera: Conjunctivae normal.     Pupils: Pupils are equal, round, and reactive to light.  Cardiovascular:     Rate and Rhythm: Normal rate and regular rhythm.     Heart sounds: Normal heart sounds. No murmur heard.  No gallop.   Pulmonary:     Effort: Pulmonary effort is normal. No accessory muscle usage or respiratory distress.     Breath sounds: Normal breath sounds.  Abdominal:     General: Bowel  sounds are normal.     Palpations: Abdomen is soft.  Musculoskeletal:     Cervical back: Normal range of motion and neck supple.     Right lower leg: No edema.     Left lower leg: No edema.  Skin:    General: Skin is warm and dry.     Findings: Wound present.     Comments: Approx 1/2 to 1 cm open wound under left axilla with healing present (decreased in size).  Scant white drainage present from wound and scant on dressing.  Mild erythema around exterior wound with no warmth or edema -- much improved since last assessment.  No tenderness to palpation.  At this time area cleansed and steri strips applied to allow for natural healing.  Dressing non adherent applied over top.  Mild erythema to exterior wound due to tape.  Neurological:     Mental Status: She is alert and oriented to person, place, and time.  Psychiatric:        Attention and Perception: Attention  normal.        Mood and Affect: Mood normal.        Speech: Speech normal.        Behavior: Behavior normal. Behavior is cooperative.        Thought Content: Thought content normal.     Results for orders placed or performed in visit on 07/28/19  Novel Coronavirus, NAA ([facility])   Specimen: Nasopharyngeal(NP) swabs in vial transport medium   NASOPHARYNGE  TESTING  Result Value Ref Range   SARS-CoV-2, NAA Not Detected Not Detected  SARS-COV-2, NAA 2 DAY TAT   NASOPHARYNGE  TESTING  Result Value Ref Range   SARS-CoV-2, NAA 2 DAY TAT Performed       Assessment & Plan:   Problem List Items Addressed This Visit      Other   Abscess    Under left axilla, healing -- much improved and smaller in size.  No s/s infection present.  Continue to use remaining pain medication as needed, recommend alternating this with plain Tylenol as needed.  Reppplied steri strips and will allow area to continue to heal naturally at this time, no further packing.  Will add on Doxycycline 100 MG BID x 5 days due to ongoing small amount drainage.  Return on Friday for follow-up -- work noted provided as can not wear bra yet.          Follow up plan: Return in about 4 days (around 01/16/2020) for Wound check.

## 2020-01-12 NOTE — Assessment & Plan Note (Signed)
Under left axilla, healing -- much improved and smaller in size.  No s/s infection present.  Continue to use remaining pain medication as needed, recommend alternating this with plain Tylenol as needed.  Reppplied steri strips and will allow area to continue to heal naturally at this time, no further packing.  Will add on Doxycycline 100 MG BID x 5 days due to ongoing small amount drainage.  Return on Friday for follow-up -- work noted provided as can not wear bra yet.

## 2020-01-14 ENCOUNTER — Ambulatory Visit: Payer: PRIVATE HEALTH INSURANCE | Admitting: Nurse Practitioner

## 2020-01-16 ENCOUNTER — Encounter: Payer: Self-pay | Admitting: Family Medicine

## 2020-01-16 ENCOUNTER — Ambulatory Visit (INDEPENDENT_AMBULATORY_CARE_PROVIDER_SITE_OTHER): Payer: PRIVATE HEALTH INSURANCE | Admitting: Family Medicine

## 2020-01-16 ENCOUNTER — Other Ambulatory Visit: Payer: Self-pay

## 2020-01-16 VITALS — BP 115/78 | HR 90 | Temp 98.1°F | Ht 61.0 in | Wt 217.0 lb

## 2020-01-16 DIAGNOSIS — L0291 Cutaneous abscess, unspecified: Secondary | ICD-10-CM

## 2020-01-16 DIAGNOSIS — L231 Allergic contact dermatitis due to adhesives: Secondary | ICD-10-CM

## 2020-01-16 MED ORDER — TRIAMCINOLONE ACETONIDE 0.5 % EX OINT
1.0000 | TOPICAL_OINTMENT | Freq: Two times a day (BID) | CUTANEOUS | 0 refills | Status: DC
Start: 2020-01-16 — End: 2020-12-23

## 2020-01-16 NOTE — Progress Notes (Signed)
BP 115/78   Pulse 90   Temp 98.1 F (36.7 C) (Oral)   Ht 5\' 1"  (1.549 m)   Wt 217 lb (98.4 kg)   LMP 12/27/2019 (Exact Date)   SpO2 98%   BMI 41.00 kg/m    Subjective:    Patient ID: 12/29/2019, female    DOB: 1980/09/06, 39 y.o.   MRN: [id number]  HPI: Evelyn Webster is a 39 y.o. female  Chief Complaint  Patient presents with  . Wound Check    Patient states wound is still painful and draining   Wound is still hurting, but feeling a bit better, a little bit of some drainage, but generally doing well. Tolerating the doxycycline. No redness, no fevers. She is otherwise doing well. She does have a rash from the tape under her arm that burns.   Relevant past medical, surgical, family and social history reviewed and updated as indicated. Interim medical history since our last visit reviewed. Allergies and medications reviewed and updated.  Review of Systems  Constitutional: Negative.   Respiratory: Negative.   Cardiovascular: Negative.   Gastrointestinal: Negative.   Musculoskeletal: Negative.   Skin: Positive for rash and wound. Negative for color change and pallor.  Neurological: Negative.   Psychiatric/Behavioral: Negative.     Per HPI unless specifically indicated above     Objective:    BP 115/78   Pulse 90   Temp 98.1 F (36.7 C) (Oral)   Ht 5\' 1"  (1.549 m)   Wt 217 lb (98.4 kg)   LMP 12/27/2019 (Exact Date)   SpO2 98%   BMI 41.00 kg/m   Wt Readings from Last 3 Encounters:  01/16/20 217 lb (98.4 kg)  01/12/20 216 lb (98 kg)  01/07/20 216 lb (98 kg)    Physical Exam Vitals and nursing note reviewed.  Constitutional:      General: She is not in acute distress.    Appearance: Normal appearance. She is not ill-appearing, toxic-appearing or diaphoretic.  HENT:     Head: Normocephalic and atraumatic.     Right Ear: External ear normal.     Left Ear: External ear normal.     Nose: Nose normal.     Mouth/Throat:     Mouth: Mucous membranes are  moist.     Pharynx: Oropharynx is clear.  Eyes:     General: No scleral icterus.       Right eye: No discharge.        Left eye: No discharge.     Extraocular Movements: Extraocular movements intact.     Conjunctiva/sclera: Conjunctivae normal.     Pupils: Pupils are equal, round, and reactive to light.  Cardiovascular:     Rate and Rhythm: Normal rate and regular rhythm.     Pulses: Normal pulses.     Heart sounds: Normal heart sounds. No murmur heard.  No friction rub. No gallop.   Pulmonary:     Effort: Pulmonary effort is normal. No respiratory distress.     Breath sounds: Normal breath sounds. No stridor. No wheezing, rhonchi or rales.  Chest:     Chest wall: No tenderness.  Musculoskeletal:        General: Normal range of motion.     Cervical back: Normal range of motion and neck supple.  Skin:    General: Skin is warm and dry.     Capillary Refill: Capillary refill takes less than 2 seconds.     Coloration: Skin is not jaundiced or  pale.     Findings: No bruising, erythema, lesion or rash.     Comments: Wound under L arm, excoriated rash under R arm  Neurological:     General: No focal deficit present.     Mental Status: She is alert and oriented to person, place, and time. Mental status is at baseline.  Psychiatric:        Mood and Affect: Mood normal.        Behavior: Behavior normal.        Thought Content: Thought content normal.        Judgment: Judgment normal.     Results for orders placed or performed in visit on 07/28/19  Novel Coronavirus, NAA ([facility])   Specimen: Nasopharyngeal(NP) swabs in vial transport medium   NASOPHARYNGE  TESTING  Result Value Ref Range   SARS-CoV-2, NAA Not Detected Not Detected  SARS-COV-2, NAA 2 DAY TAT   NASOPHARYNGE  TESTING  Result Value Ref Range   SARS-CoV-2, NAA 2 DAY TAT Performed       Assessment & Plan:   Problem List Items Addressed This Visit      Other   Abscess - Primary    Small amount of cheesy  material and scant amount of pus drained out w some pressure today. Finish abx. Will keep her out of work another 5 days. Call w concerns       Other Visit Diagnoses    Allergic contact dermatitis due to adhesives       Will treat with triamcinalone. Call with any concerns. Continue to monitor.       Follow up plan: Return if symptoms worsen or fail to improve.   >20 minutes spent with patient today.

## 2020-01-16 NOTE — Assessment & Plan Note (Signed)
Small amount of cheesy material and scant amount of pus drained out w some pressure today. Finish abx. Will keep her out of work another 5 days. Call w concerns

## 2020-01-28 ENCOUNTER — Ambulatory Visit (INDEPENDENT_AMBULATORY_CARE_PROVIDER_SITE_OTHER): Payer: PRIVATE HEALTH INSURANCE | Admitting: Unknown Physician Specialty

## 2020-01-28 ENCOUNTER — Other Ambulatory Visit: Payer: Self-pay

## 2020-01-28 ENCOUNTER — Encounter: Payer: Self-pay | Admitting: Unknown Physician Specialty

## 2020-01-28 DIAGNOSIS — L0291 Cutaneous abscess, unspecified: Secondary | ICD-10-CM | POA: Diagnosis not present

## 2020-01-28 NOTE — Progress Notes (Signed)
BP 107/75   Pulse 91   Temp 98 F (36.7 C) (Oral)   Wt 217 lb 3.2 oz (98.5 kg)   LMP 01/19/2020 (Approximate)   SpO2 97%   BMI 41.04 kg/m    Subjective:    Patient ID: Evelyn Webster, female    DOB: 1980-06-11, 39 y.o.   MRN: [id number]  HPI: Evelyn Webster is a 39 y.o. female  Chief Complaint  Patient presents with  . Cyst    2 week f/up- under left armpit, states it is still draining    Cyst Pt with draining cyst under right armpit.  Had it drained in the ER 4 weeks ago. States it is still draining with occasional pain.  She is frustrated as it's been a month.  She has completed antibiotics.    Relevant past medical, surgical, family and social history reviewed and updated as indicated. Interim medical history since our last visit reviewed. Allergies and medications reviewed and updated.  Review of Systems  Per HPI unless specifically indicated above     Objective:    BP 107/75   Pulse 91   Temp 98 F (36.7 C) (Oral)   Wt 217 lb 3.2 oz (98.5 kg)   LMP 01/19/2020 (Approximate)   SpO2 97%   BMI 41.04 kg/m   Wt Readings from Last 3 Encounters:  01/28/20 217 lb 3.2 oz (98.5 kg)  01/16/20 217 lb (98.4 kg)  01/12/20 216 lb (98 kg)    Physical Exam Constitutional:      General: She is not in acute distress.    Appearance: Normal appearance. She is well-developed.  HENT:     Head: Normocephalic and atraumatic.  Eyes:     General: Lids are normal. No scleral icterus.       Right eye: No discharge.        Left eye: No discharge.     Conjunctiva/sclera: Conjunctivae normal.  Neck:     Vascular: No carotid bruit or JVD.  Cardiovascular:     Rate and Rhythm: Normal rate and regular rhythm.     Heart sounds: Normal heart sounds.  Pulmonary:     Effort: Pulmonary effort is normal.     Breath sounds: Normal breath sounds.  Abdominal:     Palpations: There is no hepatomegaly or splenomegaly.  Musculoskeletal:        General: Normal range of motion.      Cervical back: Normal range of motion and neck supple.  Skin:    General: Skin is warm and dry.     Coloration: Skin is not pale.     Findings: No rash.     Comments: Area under left arm with small fistula without erythema or induration.  Scant discharge on bandage  Neurological:     Mental Status: She is alert and oriented to person, place, and time.  Psychiatric:        Behavior: Behavior normal.        Thought Content: Thought content normal.        Judgment: Judgment normal.     Results for orders placed or performed in visit on 07/28/19  Novel Coronavirus, NAA ([facility])   Specimen: Nasopharyngeal(NP) swabs in vial transport medium   NASOPHARYNGE  TESTING  Result Value Ref Range   SARS-CoV-2, NAA Not Detected Not Detected  SARS-COV-2, NAA 2 DAY TAT   NASOPHARYNGE  TESTING  Result Value Ref Range   SARS-CoV-2, NAA 2 DAY TAT Performed  Assessment & Plan:   Problem List Items Addressed This Visit      Unprioritized   Abscess    Healing well but frustrated as it's been a month. Will refer to dermatology for further management.        Relevant Orders   Ambulatory referral to Dermatology      Redressed, reassurance given.  Appointment made with dermatology as she is frustrated.    Follow up plan: Return if symptoms worsen or fail to improve.

## 2020-01-28 NOTE — Assessment & Plan Note (Signed)
Healing well but frustrated as it's been a month. Will refer to dermatology for further management.

## 2020-02-12 IMAGING — MR MR ABDOMEN WO/W CM
10 of 17 series · 26 of 48 positions shown · IV contrast (19mL Multihance)
Comparison: None.

CLINICAL DATA: Left adrenal mass or hemorrhage on CT scan of
07/09/2017, for further characterization.

EXAM:
MRI ABDOMEN WITHOUT AND WITH CONTRAST
TECHNIQUE: Multiplanar multisequence MR imaging of the abdomen was performed
both before and after the administration of intravenous contrast.
CONTRAST:  19mL MULTIHANCE GADOBENATE DIMEGLUMINE 529 MG/ML IV SOLN

[Series 3: T2 · coronal · 6.0mm · 0.78mm/px · 3 of 36 slices shown]
[im 1/36]
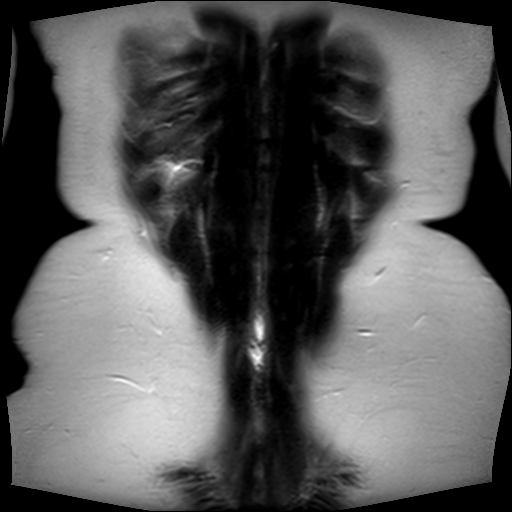
[im 18/36]
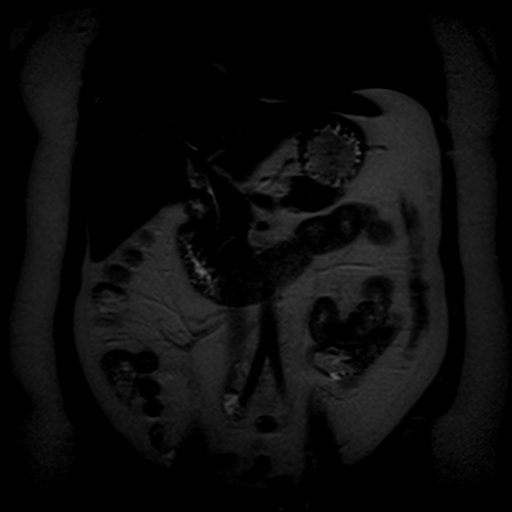
[im 36/36]
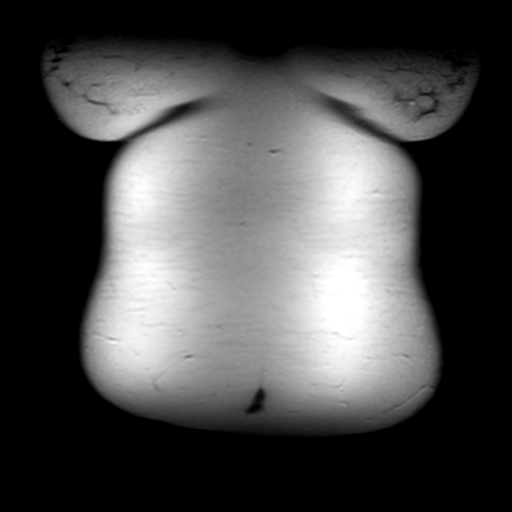

[Series 4: T2 fat-sat · axial · 5.0mm · 0.74mm/px · z∈[-75,+149]mm · 2 of 40 slices shown]
[im 1/40]
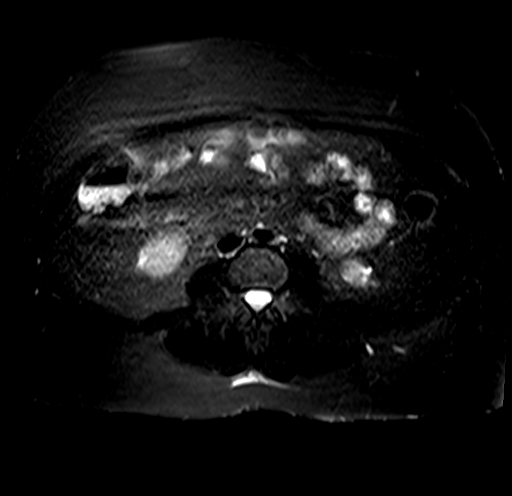
[im 40/40]
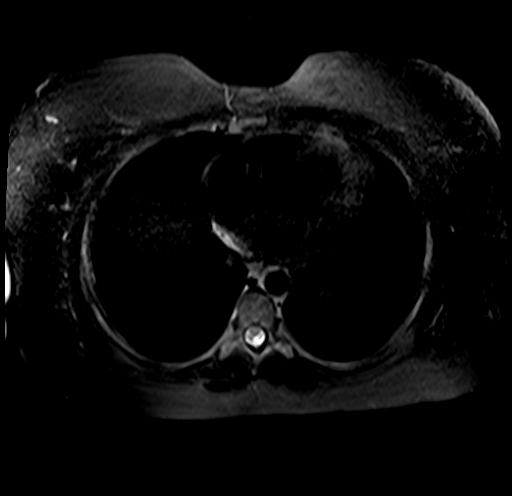

[Series 5: T1 · axial · 5.5mm · 0.74mm/px · z∈[-87,+160]mm · 3 of 80 slices shown (1 of 2)]
[im 1/80]
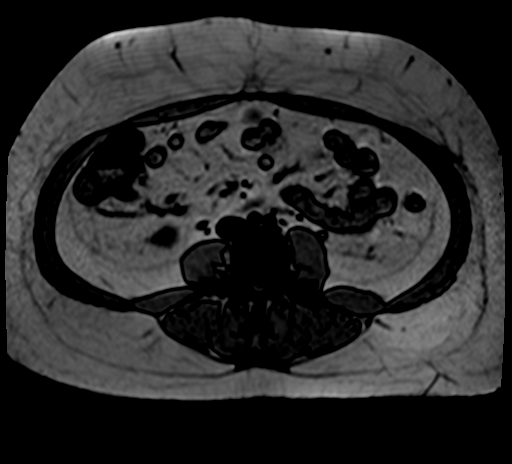
[im 40/80]
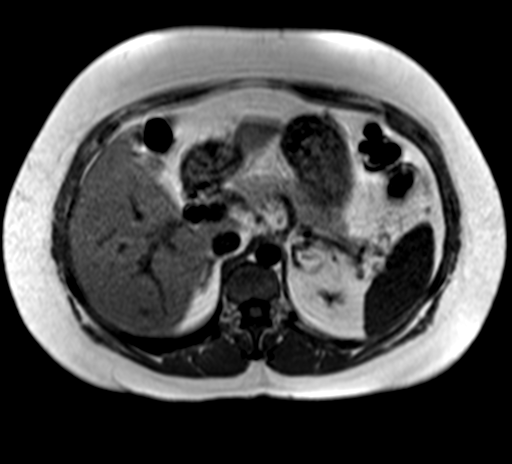
[im 80/80]
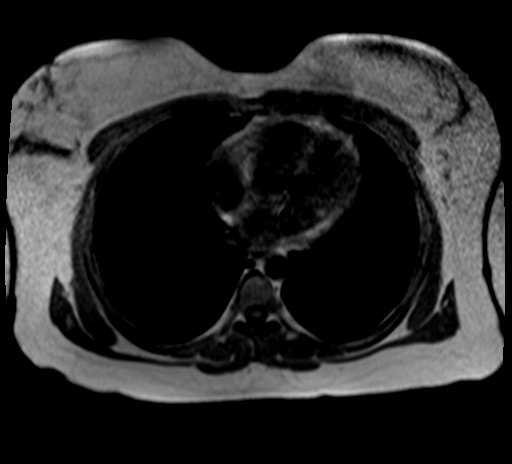

[Series 7: DWI · axial · 7.0mm · 1.98mm/px · z∈[-80,+153]mm · 4 of 90 slices shown]
[im 1/90]
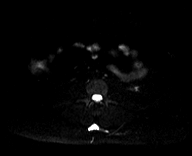
[im 30/90]
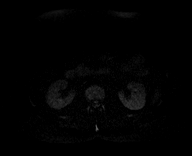
[im 60/90]
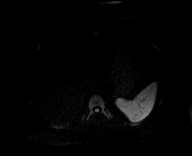
[im 90/90]
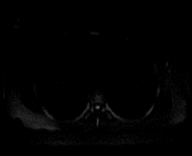

[Series 8: ax dwi_adc · axial · 7.0mm · 1.98mm/px · 1 of 30 slices shown]
[im 1/30]
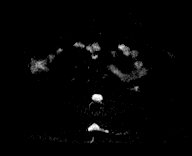

[Series 10: T1 · coronal · 6.0mm · 0.78mm/px · 3 of 68 slices shown (2 of 2)]
[im 1/68]
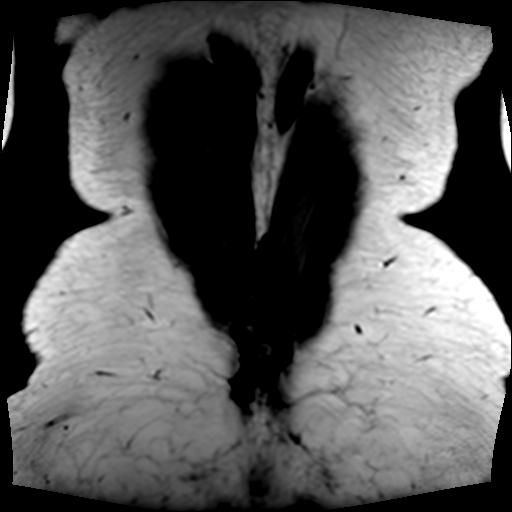
[im 34/68]
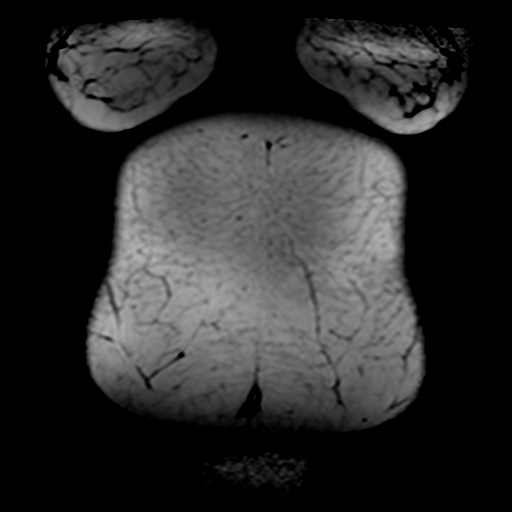
[im 68/68]
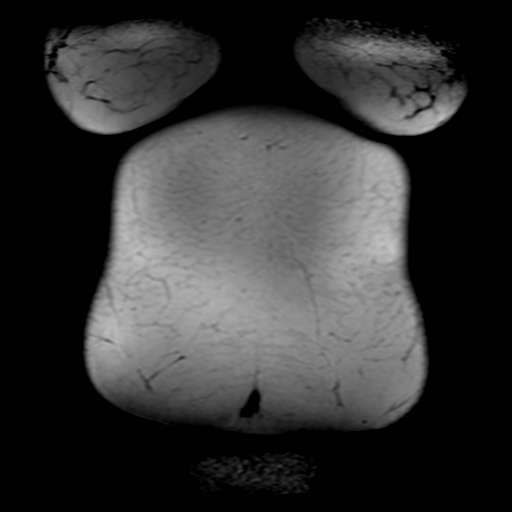

[Series 11: T1 dynamic fat-sat · axial · non-contrast · 3.0mm · 0.74mm/px · z∈[-81,+132]mm · 3 of 72 slices shown (1 of 2)]
[im 1/72]
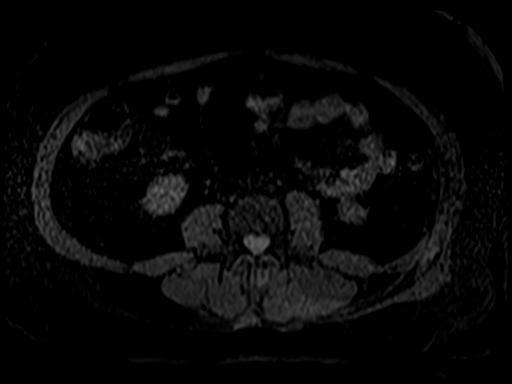
[im 36/72]
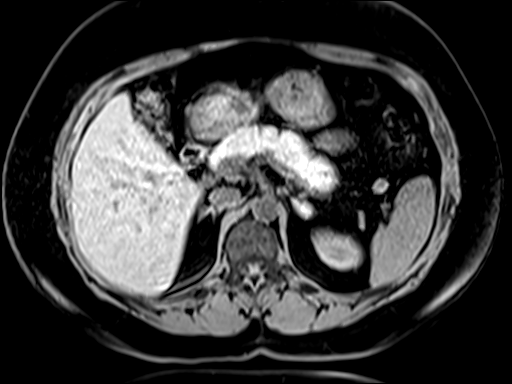
[im 72/72]
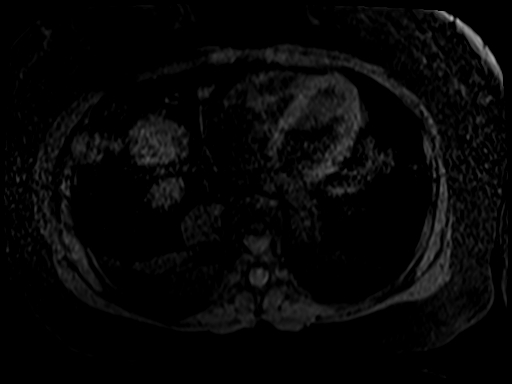

[Series 12: T1 dynamic fat-sat post-contrast · axial · 3.0mm · 0.74mm/px · z∈[-81,+132]mm · 3 of 72 slices shown (1 of 2)]
[im 1/72]
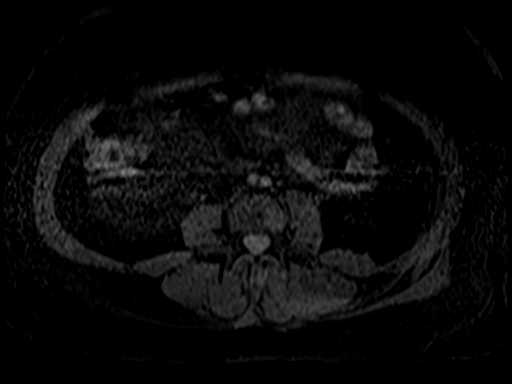
[im 36/72]
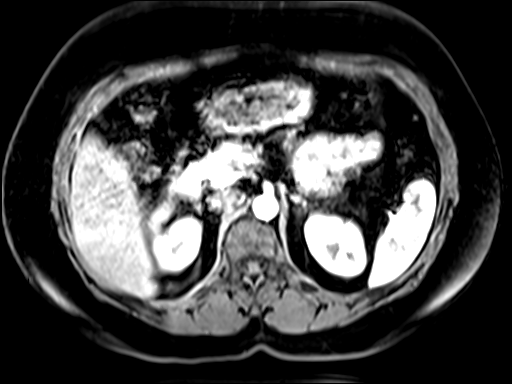
[im 72/72]
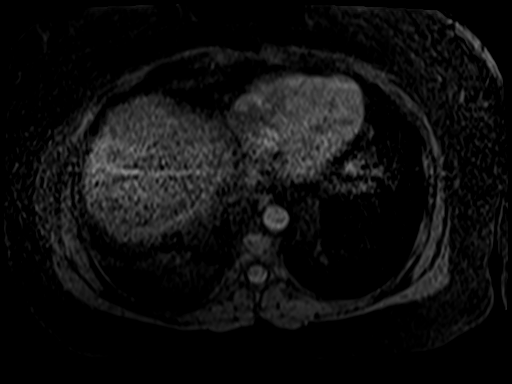

[Series 13: T1 dynamic fat-sat · axial · 3.0mm · 0.74mm/px · z∈[-81,+132]mm · 3 of 72 slices shown (2 of 2)]
[im 1/72]
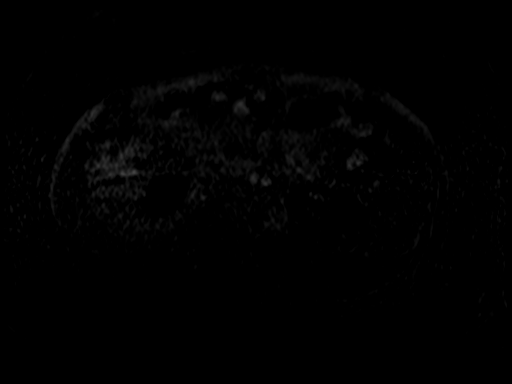
[im 36/72]
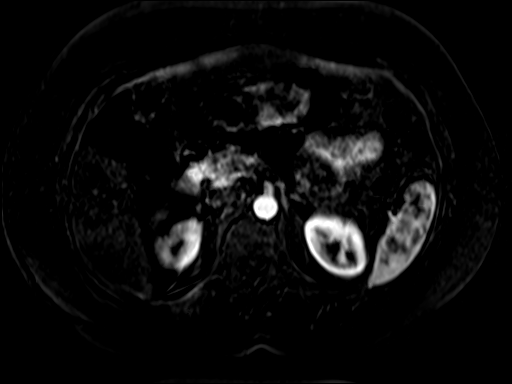
[im 72/72]
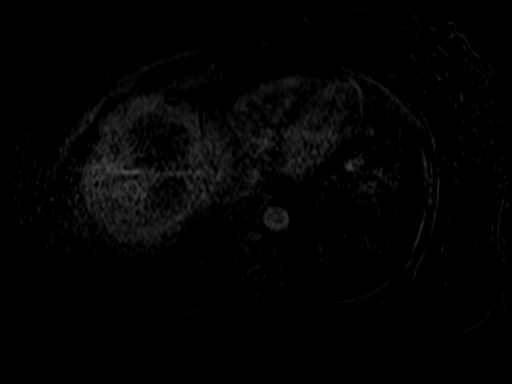

[Series 14: T1 dynamic fat-sat post-contrast · axial · 3.0mm · 0.74mm/px · 1 of 72 slices shown (2 of 2)]
[im 1/72]
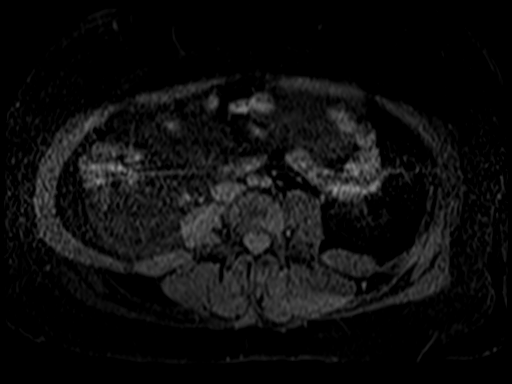

[26 of 48 positions shown; findings below may reference images not displayed]

FINDINGS: Despite efforts by the technologist and patient, motion artifact is
present on today's exam and could not be eliminated. This reduces
exam sensitivity and specificity.

Lower chest: Unremarkable

Hepatobiliary: Cholecystectomy.  Otherwise unremarkable

Pancreas:  Unremarkable

Spleen:  Unremarkable

Adrenals/Urinary Tract: 2.6 by 1.6 by 2.5 cm (volume = 5.4 cm^3)
left adrenal mass (previously 4.5 by 3.6 by 4.7 cm (volume = 40
cm^3) on 07/09/2017) with very high T1 signal characteristics and
peripherally low in centrally high T2 signal characteristics,
without appreciable enhancement based on direct measurements. The
subtraction images are not useful because they are is registered.
The appearance is compatible with improving adrenal hematoma. Right
adrenal gland normal. Kidneys unremarkable.

Stomach/Bowel: Unremarkable

Vascular/Lymphatic:  Unremarkable

Other:  No supplemental non-categorized findings.

Musculoskeletal: Degenerative disc disease at L5-S1.
IMPRESSION: 1. Improving left adrenal hematoma, current volume 5.4 cubic cm,
previously volume 40 cubic cm. This hematoma has high T1 signal
characteristics along with a rim of hemosiderin, and is not
appreciably enhancing.
2. Degenerative disc disease at L5-S1.

## 2020-03-15 ENCOUNTER — Ambulatory Visit: Payer: PRIVATE HEALTH INSURANCE | Admitting: Family Medicine

## 2020-03-17 ENCOUNTER — Other Ambulatory Visit: Payer: Self-pay

## 2020-03-17 ENCOUNTER — Encounter: Payer: Self-pay | Admitting: Family Medicine

## 2020-03-17 ENCOUNTER — Ambulatory Visit (INDEPENDENT_AMBULATORY_CARE_PROVIDER_SITE_OTHER): Payer: PRIVATE HEALTH INSURANCE | Admitting: Family Medicine

## 2020-03-17 DIAGNOSIS — M79673 Pain in unspecified foot: Secondary | ICD-10-CM | POA: Insufficient documentation

## 2020-03-17 DIAGNOSIS — M79672 Pain in left foot: Secondary | ICD-10-CM

## 2020-03-17 MED ORDER — DICLOFENAC SODIUM 1 % EX GEL: 4.0000 g | Freq: Four times a day (QID) | CUTANEOUS | 1 refills | Status: AC

## 2020-03-17 NOTE — Patient Instructions (Signed)
It was great to see you!  Our plans for today:  - Try a shoe with a wider toe box - Use the diclofenac gel for pain relief. - Come back if no better.  Take care and seek immediate care sooner if you develop any concerns.   Dr. Linwood Dibbles

## 2020-03-17 NOTE — Assessment & Plan Note (Signed)
Likely aggravated by poor fitting work shoe. No abnormalities appreciated on exam or bedside US. Recommend work shoe with wider toe box and mid compartment. Diclofenac gel provided for pain relief. F/u if no better or worsened symptoms.

## 2020-03-17 NOTE — Progress Notes (Signed)
   SUBJECTIVE:   CHIEF COMPLAINT / HPI:   Patient Active Problem List   Diagnosis Date Noted  . Foot pain 03/17/2020  . Abscess 12/31/2019  . Cough 07/28/2019  . Left sided abdominal pain 05/30/2019  . Left wrist tendinitis 02/14/2019  . Allergic rhinitis due to allergen 08/09/2018  . Chronic obstructive pulmonary disease (HCC) 02/07/2018  . Chronic GERD 07/20/2017  . Generalized anxiety disorder with panic attacks 07/12/2017  . 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  Duration: months Involved foot: left Mechanism of injury: unknown Location: top Onset: gradual  Severity: moderate  Quality:  burning Frequency: constant Radiation: no Aggravating factors: wearing work shoes  Alleviating factors: rest  Status: stable Treatments attempted: rest and APAP  Relief with NSAIDs?:  No NSAIDs Taken Weakness with weight bearing or walking: no Morning stiffness: no Swelling: no Redness: no Bruising: no Paresthesias / decreased sensation: no  Fevers:no   OBJECTIVE:   BP 122/86   Pulse 89   Temp 97.7 F (36.5 C)   Wt 218 lb 12.8 oz (99.2 kg)   SpO2 97%   BMI 41.34 kg/m   Gen: well appearing, in NAD Ext: WWP, no edema. No asymmetry, erythema, bruising, or swelling noted to feet bilaterally. Intact DP pulses bilaterally. Slight TTP to dorsum of L foot over base of 2-4th metatarsals.  Bedside US performed without evidence of hyperemia, fracture, tendinopathy, soft tissue changes.  ASSESSMENT/PLAN:   Foot pain Likely aggravated by poor fitting work shoe. No abnormalities appreciated on exam or bedside US. Recommend work shoe with wider toe box and mid compartment. Diclofenac gel provided for pain relief. F/u if no better or worsened symptoms.    Caro Laroche, DO

## 2020-04-20 ENCOUNTER — Telehealth (INDEPENDENT_AMBULATORY_CARE_PROVIDER_SITE_OTHER): Payer: PRIVATE HEALTH INSURANCE | Admitting: Family Medicine

## 2020-04-20 ENCOUNTER — Encounter: Payer: Self-pay | Admitting: Family Medicine

## 2020-04-20 DIAGNOSIS — Z538 Procedure and treatment not carried out for other reasons: Secondary | ICD-10-CM

## 2020-04-20 NOTE — Progress Notes (Signed)
Patient does not have video capacity and is complaining of an abscess under her arm. This is not appropriate for a virtual visit. Will convert to a in person visit and see her this afternoon.

## 2020-04-23 ENCOUNTER — Ambulatory Visit (INDEPENDENT_AMBULATORY_CARE_PROVIDER_SITE_OTHER): Payer: PRIVATE HEALTH INSURANCE | Admitting: Family Medicine

## 2020-04-23 ENCOUNTER — Encounter: Payer: Self-pay | Admitting: Family Medicine

## 2020-04-23 ENCOUNTER — Other Ambulatory Visit: Payer: Self-pay

## 2020-04-23 VITALS — BP 117/79 | HR 93 | Temp 97.8°F | Wt 225.0 lb

## 2020-04-23 DIAGNOSIS — L03112 Cellulitis of left axilla: Secondary | ICD-10-CM

## 2020-04-23 MED ORDER — CHLORHEXIDINE GLUCONATE 4 % EX LIQD: Freq: Every day | CUTANEOUS | 12 refills | Status: DC | PRN

## 2020-04-23 MED ORDER — SULFAMETHOXAZOLE-TRIMETHOPRIM 800-160 MG PO TABS: 1.0000 | ORAL_TABLET | Freq: Two times a day (BID) | ORAL | 0 refills | Status: DC

## 2020-04-23 NOTE — Progress Notes (Signed)
BP 117/79   Pulse 93   Temp 97.8 F (36.6 C)   Wt 225 lb (102.1 kg)   SpO2 98%   BMI 42.51 kg/m    Subjective:    Patient ID: Evelyn Webster, female    DOB: September 24, 1980, 40 y.o.   MRN: [id number]  HPI: Evelyn Webster is a 40 y.o. female  Chief Complaint  Patient presents with  . Abscess    Abscess in left axillary for about 5 days.    ABSCESS Duration: 5 days Location:  Under L arm Pain:  yes Quality:  Aching and sore Severity: moderate Redness:  yes Swelling:  yes Warmth:  yes Oozing:  no Pus:  no Treatments attempted:warm compresses Past similar infections:  yes Past MRSA skin infections:  no History of trauma in area:  no Fevers:  no Nausea/vomiting:  no  Relevant past medical, surgical, family and social history reviewed and updated as indicated. Interim medical history since our last visit reviewed. Allergies and medications reviewed and updated.  Review of Systems  Constitutional: Negative.   Respiratory: Negative.   Cardiovascular: Negative.   Gastrointestinal: Negative.   Skin: Positive for color change and wound. Negative for pallor and rash.  Psychiatric/Behavioral: Negative.     Per HPI unless specifically indicated above     Objective:    BP 117/79   Pulse 93   Temp 97.8 F (36.6 C)   Wt 225 lb (102.1 kg)   SpO2 98%   BMI 42.51 kg/m   Wt Readings from Last 3 Encounters:  04/23/20 225 lb (102.1 kg)  03/17/20 218 lb 12.8 oz (99.2 kg)  01/28/20 217 lb 3.2 oz (98.5 kg)    Physical Exam Vitals and nursing note reviewed.  Constitutional:      General: She is not in acute distress.    Appearance: Normal appearance. She is not ill-appearing, toxic-appearing or diaphoretic.  HENT:     Head: Normocephalic and atraumatic.     Right Ear: External ear normal.     Left Ear: External ear normal.     Nose: Nose normal.     Mouth/Throat:     Mouth: Mucous membranes are moist.     Pharynx: Oropharynx is clear.  Eyes:     General: No  scleral icterus.       Right eye: No discharge.        Left eye: No discharge.     Extraocular Movements: Extraocular movements intact.     Conjunctiva/sclera: Conjunctivae normal.     Pupils: Pupils are equal, round, and reactive to light.  Cardiovascular:     Rate and Rhythm: Normal rate and regular rhythm.     Pulses: Normal pulses.     Heart sounds: Normal heart sounds. No murmur heard. No friction rub. No gallop.   Pulmonary:     Effort: Pulmonary effort is normal. No respiratory distress.     Breath sounds: Normal breath sounds. No stridor. No wheezing, rhonchi or rales.  Chest:     Chest wall: No tenderness.  Musculoskeletal:        General: Normal range of motion.     Cervical back: Normal range of motion and neck supple.  Skin:    General: Skin is warm and dry.     Capillary Refill: Capillary refill takes less than 2 seconds.     Coloration: Skin is not jaundiced or pale.     Findings: Erythema (3 inch area of erythema under L arm with 2  small pustules non-fluctuant inside about 1cm each ) present. No bruising, lesion or rash.  Neurological:     General: No focal deficit present.     Mental Status: She is alert and oriented to person, place, and time. Mental status is at baseline.  Psychiatric:        Mood and Affect: Mood normal.        Behavior: Behavior normal.        Thought Content: Thought content normal.        Judgment: Judgment normal.     Results for orders placed or performed in visit on 07/28/19  Novel Coronavirus, NAA ([facility])   Specimen: Nasopharyngeal(NP) swabs in vial transport medium   NASOPHARYNGE  TESTING  Result Value Ref Range   SARS-CoV-2, NAA Not Detected Not Detected  SARS-COV-2, NAA 2 DAY TAT   NASOPHARYNGE  TESTING  Result Value Ref Range   SARS-CoV-2, NAA 2 DAY TAT Performed       Assessment & Plan:   Problem List Items Addressed This Visit   None   Visit Diagnoses    Cellulitis of left axilla    -  Primary   Will treat with  bactrim. Call if not getting better or getting worse. Continue to monitor.        Follow up plan: Return in about 3 months (around 07/21/2020) for physical.

## 2020-04-26 ENCOUNTER — Other Ambulatory Visit: Payer: Self-pay | Admitting: Family Medicine

## 2020-05-14 ENCOUNTER — Encounter: Payer: PRIVATE HEALTH INSURANCE | Admitting: Family Medicine

## 2020-07-19 ENCOUNTER — Encounter: Payer: Self-pay | Admitting: Family Medicine

## 2020-07-19 ENCOUNTER — Ambulatory Visit (INDEPENDENT_AMBULATORY_CARE_PROVIDER_SITE_OTHER): Payer: PRIVATE HEALTH INSURANCE | Admitting: Family Medicine

## 2020-07-19 ENCOUNTER — Other Ambulatory Visit: Payer: Self-pay

## 2020-07-19 VITALS — BP 103/73 | HR 67 | Wt 220.0 lb

## 2020-07-19 DIAGNOSIS — F410 Panic disorder [episodic paroxysmal anxiety] without agoraphobia: Secondary | ICD-10-CM | POA: Diagnosis not present

## 2020-07-19 DIAGNOSIS — L739 Follicular disorder, unspecified: Secondary | ICD-10-CM

## 2020-07-19 DIAGNOSIS — M7989 Other specified soft tissue disorders: Secondary | ICD-10-CM

## 2020-07-19 DIAGNOSIS — F411 Generalized anxiety disorder: Secondary | ICD-10-CM

## 2020-07-19 MED ORDER — DOXYCYCLINE HYCLATE 100 MG PO TABS: 100.0000 mg | ORAL_TABLET | Freq: Two times a day (BID) | ORAL | 0 refills | Status: DC

## 2020-07-19 MED ORDER — OLANZAPINE 5 MG PO TBDP
5.0000 mg | ORAL_TABLET | Freq: Every day | ORAL | 1 refills | Status: DC
Start: 2020-07-19 — End: 2021-02-11

## 2020-07-19 MED ORDER — ESCITALOPRAM OXALATE 20 MG PO TABS: ORAL_TABLET | ORAL | 1 refills | Status: DC

## 2020-07-19 NOTE — Assessment & Plan Note (Signed)
Will get her started on the olanzapine. Recheck 1 month. Continue lexapro. Call with any concerns.

## 2020-07-19 NOTE — Progress Notes (Signed)
BP 103/73   Pulse 67   Wt 220 lb (99.8 kg)   SpO2 100%   BMI 41.57 kg/m    Subjective:    Patient ID: Evelyn Webster, female    DOB: [birth year], 40 y.o.   MRN: [id number]  HPI: Evelyn Webster is a 40 y.o. female  Chief Complaint  Patient presents with  . Anxiety    Patient states she has been dealing with a lot at home so her anxiety is getting worse.   . Edema    Patient states she has noticed her left foot swelling.    ANXIETY/DEPRESSION- her mom has cancer and she has been feeling a lot more anxious. Never got her olanzepine Duration: chronic Status:uncontrolled Anxious mood: yes  Excessive worrying: yes Irritability: yes  Sweating: no Nausea: no Palpitations:no Hyperventilation: no Panic attacks: no Agoraphobia: no  Obscessions/compulsions: no Depressed mood: no Depression screen [facility] 2/9 07/19/2020 04/20/2020 04/21/2019 04/09/2018 02/07/2018  Decreased Interest 0 0 0 0 0  Down, Depressed, Hopeless 0 0 0 0 0  PHQ - 2 Score 0 0 0 0 0  Altered sleeping 1 - 0 0 0  Tired, decreased energy 1 - 1 3 0  Change in appetite 0 - 0 0 0  Feeling bad or failure about yourself  0 - 0 0 0  Trouble concentrating 0 - 0 0 0  Moving slowly or fidgety/restless 0 - 0 0 0  Suicidal thoughts 0 - 0 0 0  PHQ-9 Score 2 - 1 3 0  Difficult doing work/chores Not difficult at all - Not difficult at all - Not difficult at all   Anhedonia: no Weight changes: no Insomnia: yes hard to fall asleep  Hypersomnia: no Fatigue/loss of energy: yes Feelings of worthlessness: no Feelings of guilt: no Impaired concentration/indecisiveness: no Suicidal ideations: no  Crying spells: no Recent Stressors/Life Changes: yes   Relationship problems: no   Family stress: yes     Financial stress: no    Job stress: no    Recent death/loss: no  Has been having some swelling in the ankle she injured when on her feet all day.   SKIN INFECTION Duration: weeks Location: breasts and under arm History of  trauma in area: no Pain: yes Quality: aching  Severity: mild Redness: yes Swelling: yes Oozing: yes Pus: yes Fevers: no Nausea/vomiting: no Status: worse Treatments attempted:warm compresses  Tetanus: UTD    Relevant past medical, surgical, family and social history reviewed and updated as indicated. Interim medical history since our last visit reviewed. Allergies and medications reviewed and updated.  Review of Systems  Constitutional: Negative.   HENT: Negative.   Respiratory: Negative.   Cardiovascular: Positive for leg swelling. Negative for chest pain and palpitations.  Gastrointestinal: Negative.   Musculoskeletal: Negative.   Skin: Negative.   Neurological: Negative.   Psychiatric/Behavioral: Positive for dysphoric mood. Negative for agitation, behavioral problems, confusion, decreased concentration, hallucinations, self-injury, sleep disturbance and suicidal ideas. The patient is nervous/anxious. The patient is not hyperactive.     Per HPI unless specifically indicated above     Objective:    BP 103/73   Pulse 67   Wt 220 lb (99.8 kg)   SpO2 100%   BMI 41.57 kg/m   Wt Readings from Last 3 Encounters:  07/19/20 220 lb (99.8 kg)  04/23/20 225 lb (102.1 kg)  03/17/20 218 lb 12.8 oz (99.2 kg)    Physical Exam Vitals and nursing note reviewed.  Constitutional:  General: She is not in acute distress.    Appearance: Normal appearance. She is not ill-appearing, toxic-appearing or diaphoretic.  HENT:     Head: Normocephalic and atraumatic.     Right Ear: External ear normal.     Left Ear: External ear normal.     Nose: Nose normal.     Mouth/Throat:     Mouth: Mucous membranes are moist.     Pharynx: Oropharynx is clear.  Eyes:     General: No scleral icterus.       Right eye: No discharge.        Left eye: No discharge.     Extraocular Movements: Extraocular movements intact.     Conjunctiva/sclera: Conjunctivae normal.     Pupils: Pupils are  equal, round, and reactive to light.  Cardiovascular:     Rate and Rhythm: Normal rate and regular rhythm.     Pulses: Normal pulses.     Heart sounds: Normal heart sounds. No murmur heard. No friction rub. No gallop.   Pulmonary:     Effort: Pulmonary effort is normal. No respiratory distress.     Breath sounds: Normal breath sounds. No stridor. No wheezing, rhonchi or rales.  Chest:     Chest wall: No tenderness.  Musculoskeletal:        General: Normal range of motion.     Cervical back: Normal range of motion and neck supple.  Skin:    General: Skin is warm and dry.     Capillary Refill: Capillary refill takes less than 2 seconds.     Coloration: Skin is not jaundiced or pale.     Findings: No bruising, erythema, lesion or rash.     Comments: Pustules under R arm and sores on bilateral breasts- no fluctuance  Neurological:     General: No focal deficit present.     Mental Status: She is alert and oriented to person, place, and time. Mental status is at baseline.  Psychiatric:        Mood and Affect: Mood normal.        Behavior: Behavior normal.        Thought Content: Thought content normal.        Judgment: Judgment normal.     Results for orders placed or performed in visit on 07/28/19  Novel Coronavirus, NAA ([facility])   Specimen: Nasopharyngeal(NP) swabs in vial transport medium   NASOPHARYNGE  TESTING  Result Value Ref Range   SARS-CoV-2, NAA Not Detected Not Detected  SARS-COV-2, NAA 2 DAY TAT   NASOPHARYNGE  TESTING  Result Value Ref Range   SARS-CoV-2, NAA 2 DAY TAT Performed       Assessment & Plan:   Problem List Items Addressed This Visit      Other   Generalized anxiety disorder with panic attacks    Will get her started on the olanzapine. Recheck 1 month. Continue lexapro. Call with any concerns.       Relevant Medications   escitalopram (LEXAPRO) 20 MG tablet    Other Visit Diagnoses    Folliculitis    -  Primary   Will treat with  doxycycline. Call with any concerns. Continue to monitor.    Swelling of left foot       Will treat with compression hose. Call if not getting better or getting worse.        Follow up plan: Return in about 4 weeks (around 08/16/2020).

## 2020-07-23 ENCOUNTER — Encounter: Payer: Self-pay | Admitting: Family Medicine

## 2020-07-23 ENCOUNTER — Other Ambulatory Visit: Payer: Self-pay

## 2020-07-23 ENCOUNTER — Ambulatory Visit (INDEPENDENT_AMBULATORY_CARE_PROVIDER_SITE_OTHER): Payer: PRIVATE HEALTH INSURANCE | Admitting: Family Medicine

## 2020-07-23 VITALS — BP 109/75 | HR 76 | Temp 98.2°F | Ht 61.0 in | Wt 220.0 lb

## 2020-07-23 DIAGNOSIS — Z1231 Encounter for screening mammogram for malignant neoplasm of breast: Secondary | ICD-10-CM

## 2020-07-23 DIAGNOSIS — Z0000 Encounter for general adult medical examination without abnormal findings: Secondary | ICD-10-CM | POA: Diagnosis not present

## 2020-07-23 LAB — URINALYSIS, ROUTINE W REFLEX MICROSCOPIC
Bilirubin, UA: NEGATIVE
Glucose, UA: NEGATIVE
Ketones, UA: NEGATIVE
Leukocytes,UA: NEGATIVE
Nitrite, UA: NEGATIVE
Protein,UA: NEGATIVE
RBC, UA: NEGATIVE
Specific Gravity, UA: 1.025 (ref 1.005–1.030)
Urobilinogen, Ur: 0.2 mg/dL (ref 0.2–1.0)
pH, UA: 6 (ref 5.0–7.5)

## 2020-07-23 MED ORDER — NAPROXEN 500 MG PO TABS: 500.0000 mg | ORAL_TABLET | Freq: Two times a day (BID) | ORAL | 2 refills | Status: AC

## 2020-07-23 NOTE — Patient Instructions (Addendum)
Call to schedule your mammogram: [facility]  Address: [address], [place], [place] [address]  Phone: [phone]   Health Maintenance, Female Adopting a healthy lifestyle and getting preventive care are important in promoting health and wellness. Ask your health care provider about:  The right schedule for you to have regular tests and exams.  Things you can do on your own to prevent diseases and keep yourself healthy. What should I know about diet, weight, and exercise? Eat a healthy diet  Eat a diet that includes plenty of vegetables, fruits, low-fat dairy products, and lean protein.  Do not eat a lot of foods that are high in solid fats, added sugars, or sodium.   Maintain a healthy weight Body mass index (BMI) is used to identify weight problems. It estimates body fat based on height and weight. Your health care provider can help determine your BMI and help you achieve or maintain a healthy weight. Get regular exercise Get regular exercise. This is one of the most important things you can do for your health. Most adults should:  Exercise for at least 150 minutes each week. The exercise should increase your heart rate and make you sweat (moderate-intensity exercise).  Do strengthening exercises at least twice a week. This is in addition to the moderate-intensity exercise.  Spend less time sitting. Even light physical activity can be beneficial. Watch cholesterol and blood lipids Have your blood tested for lipids and cholesterol at 40 years of age, then have this test every 5 years. Have your cholesterol levels checked more often if:  Your lipid or cholesterol levels are high.  You are older than 40 years of age.  You are at high risk for heart disease. What should I know about cancer screening? Depending on your health history and family history, you may need to have cancer screening at various ages. This may include screening  for:  Breast cancer.  Cervical cancer.  Colorectal cancer.  Skin cancer.  Lung cancer. What should I know about heart disease, diabetes, and high blood pressure? Blood pressure and heart disease  High blood pressure causes heart disease and increases the risk of stroke. This is more likely to develop in people who have high blood pressure readings, are of African descent, or are overweight.  Have your blood pressure checked: ? Every 3-5 years if you are 18-39 years of age. ? Every year if you are 40 years old or older. Diabetes Have regular diabetes screenings. This checks your fasting blood sugar level. Have the screening done:  Once every three years after age 40 if you are at a normal weight and have a low risk for diabetes.  More often and at a younger age if you are overweight or have a high risk for diabetes. What should I know about preventing infection? Hepatitis B If you have a higher risk for hepatitis B, you should be screened for this virus. Talk with your health care provider to find out if you are at risk for hepatitis B infection. Hepatitis C Testing is recommended for:  Everyone born from 1945 through 1965.  Anyone with known risk factors for hepatitis C. Sexually transmitted infections (STIs)  Get screened for STIs, including gonorrhea and chlamydia, if: ? You are sexually active and are younger than 40 years of age. ? You are older than 40 years of age and your health care provider tells you that you are at risk for this type of   infection. ? Your sexual activity has changed since you were last screened, and you are at increased risk for chlamydia or gonorrhea. Ask your health care provider if you are at risk.  Ask your health care provider about whether you are at high risk for HIV. Your health care provider may recommend a prescription medicine to help prevent HIV infection. If you choose to take medicine to prevent HIV, you should first get tested for HIV.  You should then be tested every 3 months for as long as you are taking the medicine. Pregnancy  If you are about to stop having your period (premenopausal) and you may become pregnant, seek counseling before you get pregnant.  Take 400 to 800 micrograms (mcg) of folic acid every day if you become pregnant.  Ask for birth control (contraception) if you want to prevent pregnancy. Osteoporosis and menopause Osteoporosis is a disease in which the bones lose minerals and strength with aging. This can result in bone fractures. If you are 65 years old or older, or if you are at risk for osteoporosis and fractures, ask your health care provider if you should:  Be screened for bone loss.  Take a calcium or vitamin D supplement to lower your risk of fractures.  Be given hormone replacement therapy (HRT) to treat symptoms of menopause. Follow these instructions at home: Lifestyle  Do not use any products that contain nicotine or tobacco, such as cigarettes, e-cigarettes, and chewing tobacco. If you need help quitting, ask your health care provider.  Do not use street drugs.  Do not share needles.  Ask your health care provider for help if you need support or information about quitting drugs. Alcohol use  Do not drink alcohol if: ? Your health care provider tells you not to drink. ? You are pregnant, may be pregnant, or are planning to become pregnant.  If you drink alcohol: ? Limit how much you use to 0-1 drink a day. ? Limit intake if you are breastfeeding.  Be aware of how much alcohol is in your drink. In the [place] one drink equals one 12 oz bottle of beer (355 mL), one 5 oz glass of wine (148 mL), or one 1 oz glass of hard liquor (44 mL). General instructions  Schedule regular health, dental, and eye exams.  Stay current with your vaccines.  Tell your health care provider if: ? You often feel depressed. ? You have ever been abused or do not feel safe at  home. Summary  Adopting a healthy lifestyle and getting preventive care are important in promoting health and wellness.  Follow your health care provider's instructions about healthy diet, exercising, and getting tested or screened for diseases.  Follow your health care provider's instructions on monitoring your cholesterol and blood pressure. This information is not intended to replace advice given to you by your health care provider. Make sure you discuss any questions you have with your health care provider. Document Revised: 02/13/2018 Document Reviewed: 02/13/2018 [employer]vier Patient Education  2021 [employer]  
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
## 2020-07-24 LAB — COMPREHENSIVE METABOLIC PANEL
ALT: 16 IU/L (ref 0–32)
AST: 10 IU/L (ref 0–40)
Albumin/Globulin Ratio: 1.3 (ref 1.2–2.2)
Albumin: 3.9 g/dL (ref 3.8–4.8)
Alkaline Phosphatase: 104 IU/L (ref 44–121)
BUN/Creatinine Ratio: 13 (ref 9–23)
BUN: 9 mg/dL (ref 6–24)
Bilirubin Total: 0.3 mg/dL (ref 0.0–1.2)
CO2: 22 mmol/L (ref 20–29)
Calcium: 8.9 mg/dL (ref 8.7–10.2)
Chloride: 100 mmol/L (ref 96–106)
Creatinine, Ser: 0.69 mg/dL (ref 0.57–1.00)
Globulin, Total: 3.1 g/dL (ref 1.5–4.5)
Glucose: 181 mg/dL — ABNORMAL HIGH (ref 65–99)
Potassium: 4.2 mmol/L (ref 3.5–5.2)
Sodium: 138 mmol/L (ref 134–144)
Total Protein: 7 g/dL (ref 6.0–8.5)
eGFR: 112 mL/min/{1.73_m2} (ref >59)

## 2020-07-24 LAB — LIPID PANEL W/O CHOL/HDL RATIO
Cholesterol, Total: 171 mg/dL (ref 100–199)
HDL: 33 mg/dL — ABNORMAL LOW (ref >39)
LDL Chol Calc (NIH): 105 mg/dL — ABNORMAL HIGH (ref 0–99)
Triglycerides: 188 mg/dL — ABNORMAL HIGH (ref 0–149)
VLDL Cholesterol Cal: 33 mg/dL (ref 5–40)

## 2020-07-24 LAB — CBC WITH DIFFERENTIAL/PLATELET
Basophils Absolute: 0.1 10*3/uL (ref 0.0–0.2)
Basos: 1 %
EOS (ABSOLUTE): 0.3 10*3/uL (ref 0.0–0.4)
Eos: 3 %
Hematocrit: 41.1 % (ref 34.0–46.6)
Hemoglobin: 13.3 g/dL (ref 11.1–15.9)
Immature Grans (Abs): 0 10*3/uL (ref 0.0–0.1)
Immature Granulocytes: 0 %
Lymphocytes Absolute: 3.3 10*3/uL — ABNORMAL HIGH (ref 0.7–3.1)
Lymphs: 33 %
MCH: 29 pg (ref 26.6–33.0)
MCHC: 32.4 g/dL (ref 31.5–35.7)
MCV: 90 fL (ref 79–97)
Monocytes Absolute: 0.7 10*3/uL (ref 0.1–0.9)
Monocytes: 7 %
Neutrophils Absolute: 5.6 10*3/uL (ref 1.4–7.0)
Neutrophils: 56 %
Platelets: 325 10*3/uL (ref 150–450)
RBC: 4.59 x10E6/uL (ref 3.77–5.28)
RDW: 13.3 % (ref 11.7–15.4)
WBC: 9.9 10*3/uL (ref 3.4–10.8)

## 2020-07-24 LAB — TSH: TSH: 1.44 u[IU]/mL (ref 0.450–4.500)

## 2020-07-24 LAB — HEPATITIS C ANTIBODY: Hep C Virus Ab: <0.1 s/co ratio (ref 0.0–0.9)

## 2020-07-30 ENCOUNTER — Encounter: Payer: Self-pay | Admitting: Family Medicine

## 2020-07-30 ENCOUNTER — Other Ambulatory Visit: Payer: Self-pay

## 2020-07-30 ENCOUNTER — Ambulatory Visit (INDEPENDENT_AMBULATORY_CARE_PROVIDER_SITE_OTHER): Payer: PRIVATE HEALTH INSURANCE | Admitting: Family Medicine

## 2020-07-30 VITALS — BP 113/77 | HR 84 | Temp 98.1°F | Wt 220.0 lb

## 2020-07-30 DIAGNOSIS — L03119 Cellulitis of unspecified part of limb: Secondary | ICD-10-CM

## 2020-07-30 DIAGNOSIS — L02419 Cutaneous abscess of limb, unspecified: Secondary | ICD-10-CM | POA: Diagnosis not present

## 2020-07-30 MED ORDER — SULFAMETHOXAZOLE-TRIMETHOPRIM 800-160 MG PO TABS: 1.0000 | ORAL_TABLET | Freq: Two times a day (BID) | ORAL | 0 refills | Status: DC

## 2020-07-30 NOTE — Progress Notes (Signed)
BP 113/77   Pulse 84   Temp 98.1 F (36.7 C)   Wt 220 lb (99.8 kg)   SpO2 98%   BMI 41.57 kg/m    Subjective:    Patient ID: Evelyn Webster, female    DOB: 05/25/1980, 40 y.o.   MRN: [id number]  HPI: Evelyn Webster is a 40 y.o. female  Chief Complaint  Patient presents with  . skin concern    Patient states about 3 days ago she noticed a bump on her left thigh, bump has gotten big and now has a red ring around it. Patient states area is painful.    SKIN INFECTION Duration: 3 days Location: L thigh History of trauma in area: no Pain: yes Quality: aching and sore Severity: moderate Redness: yes Swelling: yes Oozing: yes Pus: yes Fevers: no Nausea/vomiting: no Status: worse Treatments attempted:warm compresses  Tetanus: UTD  Relevant past medical, surgical, family and social history reviewed and updated as indicated. Interim medical history since our last visit reviewed. Allergies and medications reviewed and updated.  Review of Systems  Constitutional: Negative.   Respiratory: Negative.   Cardiovascular: Negative.   Gastrointestinal: Negative.   Musculoskeletal: Negative.   Skin: Positive for color change and wound. Negative for pallor and rash.  Psychiatric/Behavioral: Negative.     Per HPI unless specifically indicated above     Objective:    BP 113/77   Pulse 84   Temp 98.1 F (36.7 C)   Wt 220 lb (99.8 kg)   SpO2 98%   BMI 41.57 kg/m   Wt Readings from Last 3 Encounters:  07/30/20 220 lb (99.8 kg)  07/23/20 220 lb (99.8 kg)  07/19/20 220 lb (99.8 kg)    Physical Exam Vitals and nursing note reviewed.  Constitutional:      General: She is not in acute distress.    Appearance: Normal appearance. She is not ill-appearing, toxic-appearing or diaphoretic.  HENT:     Head: Normocephalic and atraumatic.     Right Ear: External ear normal.     Left Ear: External ear normal.     Nose: Nose normal.     Mouth/Throat:     Mouth: Mucous  membranes are moist.     Pharynx: Oropharynx is clear.  Eyes:     General: No scleral icterus.       Right eye: No discharge.        Left eye: No discharge.     Extraocular Movements: Extraocular movements intact.     Conjunctiva/sclera: Conjunctivae normal.     Pupils: Pupils are equal, round, and reactive to light.  Cardiovascular:     Rate and Rhythm: Normal rate and regular rhythm.     Pulses: Normal pulses.     Heart sounds: Normal heart sounds. No murmur heard. No friction rub. No gallop.   Pulmonary:     Effort: Pulmonary effort is normal. No respiratory distress.     Breath sounds: Normal breath sounds. No stridor. No wheezing, rhonchi or rales.  Chest:     Chest wall: No tenderness.  Musculoskeletal:        General: Normal range of motion.     Cervical back: Normal range of motion and neck supple.  Skin:    General: Skin is warm and dry.     Capillary Refill: Capillary refill takes less than 2 seconds.     Coloration: Skin is not jaundiced or pale.     Findings: No bruising, erythema, lesion or rash.  Comments: Abscess on L anterior thigh draining pus, mildly fluctuant  Neurological:     General: No focal deficit present.     Mental Status: She is alert and oriented to person, place, and time. Mental status is at baseline.  Psychiatric:        Mood and Affect: Mood normal.        Behavior: Behavior normal.        Thought Content: Thought content normal.        Judgment: Judgment normal.     Results for orders placed or performed in visit on 07/23/20  Hepatitis C Antibody  Result Value Ref Range   Hep C Virus Ab <0.1 0.0 - 0.9 s/co ratio  CBC with Differential/Platelet  Result Value Ref Range   WBC 9.9 3.4 - 10.8 x10E3/uL   RBC 4.59 3.77 - 5.28 x10E6/uL   Hemoglobin 13.3 11.1 - 15.9 g/dL   Hematocrit 41.1 34.0 - 46.6 %   MCV 90 79 - 97 fL   MCH 29.0 26.6 - 33.0 pg   MCHC 32.4 31.5 - 35.7 g/dL   RDW 13.3 11.7 - 15.4 %   Platelets 325 150 - 450 x10E3/uL    Neutrophils 56 Not Estab. %   Lymphs 33 Not Estab. %   Monocytes 7 Not Estab. %   Eos 3 Not Estab. %   Basos 1 Not Estab. %   Neutrophils Absolute 5.6 1.4 - 7.0 x10E3/uL   Lymphocytes Absolute 3.3 (H) 0.7 - 3.1 x10E3/uL   Monocytes Absolute 0.7 0.1 - 0.9 x10E3/uL   EOS (ABSOLUTE) 0.3 0.0 - 0.4 x10E3/uL   Basophils Absolute 0.1 0.0 - 0.2 x10E3/uL   Immature Granulocytes 0 Not Estab. %   Immature Grans (Abs) 0.0 0.0 - 0.1 x10E3/uL  Comprehensive metabolic panel  Result Value Ref Range   Glucose 181 (H) 65 - 99 mg/dL   BUN 9 6 - 24 mg/dL   Creatinine, Ser 0.69 0.57 - 1.00 mg/dL   eGFR 112 >59 mL/min/1.73   BUN/Creatinine Ratio 13 9 - 23   Sodium 138 134 - 144 mmol/L   Potassium 4.2 3.5 - 5.2 mmol/L   Chloride 100 96 - 106 mmol/L   CO2 22 20 - 29 mmol/L   Calcium 8.9 8.7 - 10.2 mg/dL   Total Protein 7.0 6.0 - 8.5 g/dL   Albumin 3.9 3.8 - 4.8 g/dL   Globulin, Total 3.1 1.5 - 4.5 g/dL   Albumin/Globulin Ratio 1.3 1.2 - 2.2   Bilirubin Total 0.3 0.0 - 1.2 mg/dL   Alkaline Phosphatase 104 44 - 121 IU/L   AST 10 0 - 40 IU/L   ALT 16 0 - 32 IU/L  Lipid Panel w/o Chol/HDL Ratio  Result Value Ref Range   Cholesterol, Total 171 100 - 199 mg/dL   Triglycerides 188 (H) 0 - 149 mg/dL   HDL 33 (L) >39 mg/dL   VLDL Cholesterol Cal 33 5 - 40 mg/dL   LDL Chol Calc (NIH) 105 (H) 0 - 99 mg/dL  Urinalysis, Routine w reflex microscopic  Result Value Ref Range   Specific Gravity, UA 1.025 1.005 - 1.030   pH, UA 6.0 5.0 - 7.5   Color, UA Yellow Yellow   Appearance Ur Cloudy (A) Clear   Leukocytes,UA Negative Negative   Protein,UA Negative Negative/Trace   Glucose, UA Negative Negative   Ketones, UA Negative Negative   RBC, UA Negative Negative   Bilirubin, UA Negative Negative   Urobilinogen, Ur 0.2 0.2 -  1.0 mg/dL   Nitrite, UA Negative Negative  TSH  Result Value Ref Range   TSH 1.440 0.450 - 4.500 uIU/mL      Assessment & Plan:   Problem List Items Addressed This Visit    None   Visit Diagnoses    Cellulitis and abscess of leg, except foot    -  Primary   Drained today as below. Will treat with bactrim. Call with any concerns.      Skin Procedure  Procedure: I&D  Diagnosis:   ICD-10-CM   1. Cellulitis and abscess of leg, except foot  L03.119    L02.419    Drained today as below. Will treat with bactrim. Call with any concerns.     Lesion Location/Size: 1 inch abscess L anterior thigh Physician: MJ Consent:  Risks, benefits, and alternative treatments discussed and all questions were answered.  Patient elected to proceed and verbal consent obtained.  Description: Area prepped and draped using semi-sterile technique. Area locally anesthetized using 5 cc's of lidocaine 2% with epi. Using a 15 blade scalpel, a 2cm incision was made above the lesion. Abscess cavity entered and mild amount of purulent material expressed.  Adequate hemostastis achieved using packing. Area dressed with non-stick and tape after application of triple antibiotic ointment.   Post Procedure Instructions: Wound care instructions discussed and patient was instructed to keep area clean and dry.  Signs and symptoms of infection discussed, patient agrees to contact the office ASAP should they occur.  Dressing change recommended every other day.   Follow up plan: Return Tuesday for wound check.

## 2020-08-03 ENCOUNTER — Ambulatory Visit (INDEPENDENT_AMBULATORY_CARE_PROVIDER_SITE_OTHER): Payer: PRIVATE HEALTH INSURANCE | Admitting: Family Medicine

## 2020-08-03 ENCOUNTER — Encounter: Payer: Self-pay | Admitting: Family Medicine

## 2020-08-03 ENCOUNTER — Other Ambulatory Visit: Payer: Self-pay

## 2020-08-03 VITALS — BP 116/78 | HR 87 | Temp 98.2°F | Wt 220.0 lb

## 2020-08-03 DIAGNOSIS — L02419 Cutaneous abscess of limb, unspecified: Secondary | ICD-10-CM

## 2020-08-03 DIAGNOSIS — L03119 Cellulitis of unspecified part of limb: Secondary | ICD-10-CM

## 2020-08-03 NOTE — Progress Notes (Signed)
Wound healing well. Packing changed. No redness or oozing. Return for dressing change in 2 days

## 2020-08-05 ENCOUNTER — Ambulatory Visit (INDEPENDENT_AMBULATORY_CARE_PROVIDER_SITE_OTHER): Payer: PRIVATE HEALTH INSURANCE | Admitting: Family Medicine

## 2020-08-05 ENCOUNTER — Other Ambulatory Visit: Payer: Self-pay

## 2020-08-05 ENCOUNTER — Encounter: Payer: Self-pay | Admitting: Family Medicine

## 2020-08-05 VITALS — BP 110/79 | HR 75 | Temp 98.2°F | Wt 220.0 lb

## 2020-08-05 DIAGNOSIS — L02419 Cutaneous abscess of limb, unspecified: Secondary | ICD-10-CM

## 2020-08-05 DIAGNOSIS — L03119 Cellulitis of unspecified part of limb: Secondary | ICD-10-CM

## 2020-08-05 NOTE — Progress Notes (Signed)
Wound healing well. Wick changed. Return for wick change in about 4 days .

## 2020-08-08 ENCOUNTER — Emergency Department
Admission: EM | Admit: 2020-08-08 | Discharge: 2020-08-08 | Disposition: A | Discharge status: Home / Self Care | Payer: PRIVATE HEALTH INSURANCE | Attending: Emergency Medicine | Admitting: Emergency Medicine

## 2020-08-08 ENCOUNTER — Other Ambulatory Visit: Payer: Self-pay

## 2020-08-08 ENCOUNTER — Encounter: Payer: Self-pay | Admitting: Emergency Medicine

## 2020-08-08 DIAGNOSIS — F17210 Nicotine dependence, cigarettes, uncomplicated: Secondary | ICD-10-CM | POA: Insufficient documentation

## 2020-08-08 DIAGNOSIS — Z5189 Encounter for other specified aftercare: Secondary | ICD-10-CM

## 2020-08-08 DIAGNOSIS — Z4800 Encounter for change or removal of nonsurgical wound dressing: Secondary | ICD-10-CM | POA: Diagnosis not present
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
## 2020-08-08 NOTE — ED Triage Notes (Signed)
Pt reports that they took a cyst off of her left upper leg, her doctor has been packing and changing dressing for the last the week. Pt went to change her dressing at work and the packing came out of her wound she called her doctor and they told her to come to the ED to get her wound cleaned out and repacked.

## 2020-08-08 NOTE — Discharge Instructions (Signed)
Follow-up with your doctor at your neck scheduled visit.  Return to the ER for worsening symptoms, increased redness/swelling, fever or other concerns.

## 2020-08-08 NOTE — ED Notes (Signed)
ED Provider at bedside. 

## 2020-08-09 ENCOUNTER — Encounter: Payer: Self-pay | Admitting: Family Medicine

## 2020-08-09 ENCOUNTER — Ambulatory Visit (INDEPENDENT_AMBULATORY_CARE_PROVIDER_SITE_OTHER): Payer: PRIVATE HEALTH INSURANCE | Admitting: Family Medicine

## 2020-08-09 VITALS — BP 108/76 | HR 80 | Temp 98.3°F | Wt 220.0 lb

## 2020-08-09 DIAGNOSIS — L03119 Cellulitis of unspecified part of limb: Secondary | ICD-10-CM

## 2020-08-09 DIAGNOSIS — L02419 Cutaneous abscess of limb, unspecified: Secondary | ICD-10-CM

## 2020-08-09 NOTE — Progress Notes (Signed)
Here today for wick change. Had to go to ER on Saturday as her wick came out. Continuing to heal well. No redness or oozing. Evelyn Webster changed today. Return in 2 days for reevaluation/wick change. Call with any concerns.

## 2020-08-11 ENCOUNTER — Encounter: Payer: Self-pay | Admitting: Family Medicine

## 2020-08-11 ENCOUNTER — Other Ambulatory Visit: Payer: Self-pay

## 2020-08-11 ENCOUNTER — Ambulatory Visit (INDEPENDENT_AMBULATORY_CARE_PROVIDER_SITE_OTHER): Payer: PRIVATE HEALTH INSURANCE | Admitting: Family Medicine

## 2020-08-11 DIAGNOSIS — L02419 Cutaneous abscess of limb, unspecified: Secondary | ICD-10-CM

## 2020-08-11 DIAGNOSIS — L03119 Cellulitis of unspecified part of limb: Secondary | ICD-10-CM

## 2020-08-11 NOTE — Progress Notes (Signed)
Shonna Chock changed today with good results. Wound healing well. Expect 1 more wick change.

## 2020-08-13 ENCOUNTER — Other Ambulatory Visit: Payer: Self-pay

## 2020-08-13 ENCOUNTER — Encounter: Payer: Self-pay | Admitting: Family Medicine

## 2020-08-13 ENCOUNTER — Ambulatory Visit (INDEPENDENT_AMBULATORY_CARE_PROVIDER_SITE_OTHER): Payer: PRIVATE HEALTH INSURANCE | Admitting: Family Medicine

## 2020-08-13 VITALS — BP 113/81 | HR 78 | Temp 98.0°F | Wt 220.0 lb

## 2020-08-13 DIAGNOSIS — L03119 Cellulitis of unspecified part of limb: Secondary | ICD-10-CM

## 2020-08-13 DIAGNOSIS — L02419 Cutaneous abscess of limb, unspecified: Secondary | ICD-10-CM

## 2020-08-13 NOTE — Progress Notes (Signed)
Healing well. No sign of infection. Will pack 1 more time. Hope that it will be able to be unpacked on Monday

## 2020-08-16 ENCOUNTER — Ambulatory Visit (INDEPENDENT_AMBULATORY_CARE_PROVIDER_SITE_OTHER): Payer: PRIVATE HEALTH INSURANCE | Admitting: Nurse Practitioner

## 2020-08-16 ENCOUNTER — Encounter: Payer: Self-pay | Admitting: Nurse Practitioner

## 2020-08-16 DIAGNOSIS — L0291 Cutaneous abscess, unspecified: Secondary | ICD-10-CM | POA: Diagnosis not present

## 2020-08-16 NOTE — Assessment & Plan Note (Signed)
Small area to left upper thigh healing well.  Steri-strips applied to area and instructed on how to care for, allow them to fall off in shower and do not pull off.  Area with minimal drainage.  At this time will monitor and advised her if any worsening symptoms or area remains open after steri-strips fall off, then return to office.  For now return as scheduled.

## 2020-08-16 NOTE — Progress Notes (Signed)
BP 111/80   Pulse 75   Temp 98 F (36.7 C) (Oral)   Wt 220 lb 9.6 oz (100.1 kg)   SpO2 99%   BMI 41.68 kg/m    Subjective:    Patient ID: Evelyn Webster, female    DOB: [birth year], 40 y.o.   MRN: [id number]  HPI: Evelyn Webster is a 40 y.o. female  Chief Complaint  Patient presents with   Wound Check    Patient is here for a wound check. Patient states she is having a little bit of blood coming from the wound and would like for the provider to take a look.    SKIN INFECTION Started with symptoms 07/30/20 to left leg.  Currently is drained and with packing. Duration: weeks Location: History of trauma in area: no Pain: yes Quality: yes Severity: mild Redness: no Swelling: no Oozing:  a little blood Pus: no Fevers: no Nausea/vomiting: no Status: better Treatments attempted:antibiotics  Tetanus: UTD   Relevant past medical, surgical, family and social history reviewed and updated as indicated. Interim medical history since our last visit reviewed. Allergies and medications reviewed and updated.  Review of Systems  Constitutional:  Negative for activity change, appetite change, diaphoresis, fatigue and fever.  Respiratory:  Negative for cough, chest tightness and shortness of breath.   Cardiovascular: Negative.   Gastrointestinal: Negative.   Skin:  Positive for wound.  Neurological: Negative.   Psychiatric/Behavioral: Negative.     Per HPI unless specifically indicated above     Objective:    BP 111/80   Pulse 75   Temp 98 F (36.7 C) (Oral)   Wt 220 lb 9.6 oz (100.1 kg)   SpO2 99%   BMI 41.68 kg/m   Wt Readings from Last 3 Encounters:  08/16/20 220 lb 9.6 oz (100.1 kg)  08/13/20 220 lb (99.8 kg)  08/09/20 220 lb (99.8 kg)    Physical Exam Vitals and nursing note reviewed.  Constitutional:      General: She is awake. She is not in acute distress.    Appearance: She is well-developed and well-groomed. She is obese. She is not ill-appearing or  toxic-appearing.  HENT:     Head: Normocephalic.     Right Ear: Hearing normal.     Left Ear: Hearing normal.  Eyes:     General: Lids are normal.        Right eye: No discharge.        Left eye: No discharge.     Conjunctiva/sclera: Conjunctivae normal.     Pupils: Pupils are equal, round, and reactive to light.  Cardiovascular:     Rate and Rhythm: Normal rate and regular rhythm.     Heart sounds: Normal heart sounds. No murmur heard.   No gallop.  Pulmonary:     Effort: Pulmonary effort is normal. No accessory muscle usage or respiratory distress.     Breath sounds: Normal breath sounds.  Abdominal:     General: Bowel sounds are normal.     Palpations: Abdomen is soft. There is no hepatomegaly or splenomegaly.  Musculoskeletal:     Cervical back: Normal range of motion and neck supple.     Right lower leg: No edema.     Left lower leg: No edema.  Skin:    General: Skin is warm and dry.          Comments: Area around wound with some erythema present from paper tape.  Neurological:     Mental Status:  She is alert and oriented to person, place, and time.  Psychiatric:        Attention and Perception: Attention normal.        Mood and Affect: Mood normal.        Speech: Speech normal.        Behavior: Behavior normal. Behavior is cooperative.        Thought Content: Thought content normal.    Results for orders placed or performed in visit on 07/23/20  Hepatitis C Antibody  Result Value Ref Range   Hep C Virus Ab <0.1 0.0 - 0.9 s/co ratio  CBC with Differential/Platelet  Result Value Ref Range   WBC 9.9 3.4 - 10.8 x10E3/uL   RBC 4.59 3.77 - 5.28 x10E6/uL   Hemoglobin 13.3 11.1 - 15.9 g/dL   Hematocrit 41.1 34.0 - 46.6 %   MCV 90 79 - 97 fL   MCH 29.0 26.6 - 33.0 pg   MCHC 32.4 31.5 - 35.7 g/dL   RDW 13.3 11.7 - 15.4 %   Platelets 325 150 - 450 x10E3/uL   Neutrophils 56 Not Estab. %   Lymphs 33 Not Estab. %   Monocytes 7 Not Estab. %   Eos 3 Not Estab. %    Basos 1 Not Estab. %   Neutrophils Absolute 5.6 1.4 - 7.0 x10E3/uL   Lymphocytes Absolute 3.3 (H) 0.7 - 3.1 x10E3/uL   Monocytes Absolute 0.7 0.1 - 0.9 x10E3/uL   EOS (ABSOLUTE) 0.3 0.0 - 0.4 x10E3/uL   Basophils Absolute 0.1 0.0 - 0.2 x10E3/uL   Immature Granulocytes 0 Not Estab. %   Immature Grans (Abs) 0.0 0.0 - 0.1 x10E3/uL  Comprehensive metabolic panel  Result Value Ref Range   Glucose 181 (H) 65 - 99 mg/dL   BUN 9 6 - 24 mg/dL   Creatinine, Ser 0.69 0.57 - 1.00 mg/dL   eGFR 112 >59 mL/min/1.73   BUN/Creatinine Ratio 13 9 - 23   Sodium 138 134 - 144 mmol/L   Potassium 4.2 3.5 - 5.2 mmol/L   Chloride 100 96 - 106 mmol/L   CO2 22 20 - 29 mmol/L   Calcium 8.9 8.7 - 10.2 mg/dL   Total Protein 7.0 6.0 - 8.5 g/dL   Albumin 3.9 3.8 - 4.8 g/dL   Globulin, Total 3.1 1.5 - 4.5 g/dL   Albumin/Globulin Ratio 1.3 1.2 - 2.2   Bilirubin Total 0.3 0.0 - 1.2 mg/dL   Alkaline Phosphatase 104 44 - 121 IU/L   AST 10 0 - 40 IU/L   ALT 16 0 - 32 IU/L  Lipid Panel w/o Chol/HDL Ratio  Result Value Ref Range   Cholesterol, Total 171 100 - 199 mg/dL   Triglycerides 188 (H) 0 - 149 mg/dL   HDL 33 (L) >39 mg/dL   VLDL Cholesterol Cal 33 5 - 40 mg/dL   LDL Chol Calc (NIH) 105 (H) 0 - 99 mg/dL  Urinalysis, Routine w reflex microscopic  Result Value Ref Range   Specific Gravity, UA 1.025 1.005 - 1.030   pH, UA 6.0 5.0 - 7.5   Color, UA Yellow Yellow   Appearance Ur Cloudy (A) Clear   Leukocytes,UA Negative Negative   Protein,UA Negative Negative/Trace   Glucose, UA Negative Negative   Ketones, UA Negative Negative   RBC, UA Negative Negative   Bilirubin, UA Negative Negative   Urobilinogen, Ur 0.2 0.2 - 1.0 mg/dL   Nitrite, UA Negative Negative  TSH  Result Value Ref Range  TSH 1.440 0.450 - 4.500 uIU/mL      Assessment & Plan:   Problem List Items Addressed This Visit       Other   Abscess    Small area to left upper thigh healing well.  Steri-strips applied to area and  instructed on how to care for, allow them to fall off in shower and do not pull off.  Area with minimal drainage.  At this time will monitor and advised her if any worsening symptoms or area remains open after steri-strips fall off, then return to office.  For now return as scheduled.         Follow up plan: Return for follow-up as scheduled.

## 2020-08-16 NOTE — Patient Instructions (Signed)
Wound Care, Adult Taking care of your wound properly can help to prevent pain, infection, and scarring. It can also help your wound heal more quickly. Follow instructionsfrom your health care provider about how to care for your wound. Supplies needed: Soap and water. Wound cleanser. Gauze. If needed, a clean bandage (dressing) or other type of wound dressing material to cover or place in the wound. Follow your health care provider's instructions about what dressing supplies to use. Cream or ointment to apply to the wound, if told by your health care provider. How to care for your wound Cleaning the wound Ask your health care provider how to clean the wound. This may include: Using mild soap and water or a wound cleanser. Using a clean gauze to pat the wound dry after cleaning it. Do not rub or scrub the wound. Dressing care Wash your hands with soap and water for at least 20 seconds before and after you change the dressing. If soap and water are not available, use hand sanitizer. Change your dressing as told by your health care provider. This may include: Cleaning or rinsing out (irrigating) the wound. Placing a dressing over the wound or in the wound (packing). Covering the wound with an outer dressing. Leave any stitches (sutures), skin glue, or adhesive strips in place. These skin closures may need to stay in place for 2 weeks or longer. If adhesive strip edges start to loosen and curl up, you may trim the loose edges. Do not remove adhesive strips completely unless your health care provider tells you to do that. Ask your health care provider when you can leave the wound uncovered. Checking for infection Check your wound area every day for signs of infection. Check for: More redness, swelling, or pain. Fluid or blood. Warmth. Pus or a bad smell.  Follow these instructions at home Medicines If you were prescribed an antibiotic medicine, cream, or ointment, take or apply it as told by  your health care provider. Do not stop using the antibiotic even if your condition improves. If you were prescribed pain medicine, take it 30 minutes before you do any wound care or as told by your health care provider. Take over-the-counter and prescription medicines only as told by your health care provider. Eating and drinking Eat a diet that includes protein, vitamin A, vitamin C, and other nutrient-rich foods to help the wound heal. Foods rich in protein include meat, fish, eggs, dairy, beans, and nuts. Foods rich in vitamin A include carrots and dark green, leafy vegetables. Foods rich in vitamin C include citrus fruits, tomatoes, broccoli, and peppers. Drink enough fluid to keep your urine pale yellow. General instructions Do not take baths, swim, use a hot tub, or do anything that would put the wound underwater until your health care provider approves. Ask your health care provider if you may take showers. You may only be allowed to take sponge baths. Do not scratch or pick at the wound. Keep it covered as told by your health care provider. Return to your normal activities as told by your health care provider. Ask your health care provider what activities are safe for you. Protect your wound from the sun when you are outside for the first 6 months, or for as long as told by your health care provider. Cover up the scar area or apply sunscreen that has an SPF of at least 30. Do not use any products that contain nicotine or tobacco, such as cigarettes, e-cigarettes, and chewing tobacco.   These may delay wound healing. If you need help quitting, ask your health care provider. Keep all follow-up visits as told by your health care provider. This is important. Contact a health care provider if: You received a tetanus shot and you have swelling, severe pain, redness, or bleeding at the injection site. Your pain is not controlled with medicine. You have any of these signs of infection: More  redness, swelling, or pain around the wound. Fluid or blood coming from the wound. Warmth coming from the wound. Pus or a bad smell coming from the wound. A fever or chills. You are nauseous or you vomit. You are dizzy. Get help right away if: You have a red streak of skin near the area around your wound. Your wound has been closed with staples, sutures, skin glue, or adhesive strips and it begins to open up and separate. Your wound is bleeding, and the bleeding does not stop with gentle pressure. You have a rash. You faint. You have trouble breathing. These symptoms may represent a serious problem that is an emergency. Do not wait to see if the symptoms will go away. Get medical help right away. Call your local emergency services (911 in the [place] Do not drive yourself to the hospital. Summary Always wash your hands with soap and water for at least 20 seconds before and after changing your dressing. Change your dressing as told by your health care provider. To help with healing, eat foods that are rich in protein, vitamin A, vitamin C, and other nutrients. Check your wound every day for signs of infection. Contact your health care provider if you suspect that your wound is infected. This information is not intended to replace advice given to you by your health care provider. Make sure you discuss any questions you have with your healthcare provider. Document Revised: 12/06/2018 Document Reviewed: 12/06/2018 Elsevier Patient Education  2022 [employer]  

## 2020-08-17 ENCOUNTER — Ambulatory Visit: Payer: Self-pay | Admitting: *Deleted

## 2020-08-17 NOTE — Telephone Encounter (Signed)
Please advise pt scheduled 6/17

## 2020-08-17 NOTE — Telephone Encounter (Signed)
Pt reports steri-strips from incision "Coming up off skin." Questioned if incision had opened, states does not know if wound is open, "I put a dry dressing on and there is a dot of blood so I don't want to put it up to look." States has 3-4 steri-strips. Advised to keep dry sterile dressing on, monitor. States "I really don't want to mess with it.I don't want any infection." Assured pt NT would route to practice for PCPs review.   CB# [phone] Reason for Disposition  Wound repaired with skin glue, questions about    Steri-strips  Protocols used: Skin Glue Questions-A-AH

## 2020-08-17 NOTE — Telephone Encounter (Signed)
Called pt to advise of Evelyn Webster's message no answer number not accepting calls at this time

## 2020-08-20 ENCOUNTER — Ambulatory Visit: Payer: PRIVATE HEALTH INSURANCE | Admitting: Family Medicine

## 2020-08-24 ENCOUNTER — Encounter: Payer: Self-pay | Admitting: Family Medicine

## 2020-08-24 ENCOUNTER — Ambulatory Visit (INDEPENDENT_AMBULATORY_CARE_PROVIDER_SITE_OTHER): Payer: PRIVATE HEALTH INSURANCE | Admitting: Family Medicine

## 2020-08-24 ENCOUNTER — Other Ambulatory Visit: Payer: Self-pay

## 2020-08-24 VITALS — BP 110/76 | HR 87 | Wt 220.0 lb

## 2020-08-24 DIAGNOSIS — L03116 Cellulitis of left lower limb: Secondary | ICD-10-CM

## 2020-08-24 DIAGNOSIS — F411 Generalized anxiety disorder: Secondary | ICD-10-CM | POA: Diagnosis not present

## 2020-08-24 DIAGNOSIS — F410 Panic disorder [episodic paroxysmal anxiety] without agoraphobia: Secondary | ICD-10-CM

## 2020-08-24 MED ORDER — SULFAMETHOXAZOLE-TRIMETHOPRIM 800-160 MG PO TABS: 1.0000 | ORAL_TABLET | Freq: Two times a day (BID) | ORAL | 0 refills | Status: DC

## 2020-08-24 NOTE — Assessment & Plan Note (Signed)
Did not pick up her zyprexa as they didn't have it at the pharmacy. Called to pharmacy. Confirmed receipt of her Rx. Will start it. Recheck 1 month.

## 2020-08-24 NOTE — Progress Notes (Signed)
BP 110/76   Pulse 87   Wt 220 lb (99.8 kg)   SpO2 98%   BMI 41.57 kg/m    Subjective:    Patient ID: Evelyn Webster, female    DOB: 1980-08-30, 40 y.o.   MRN: [id number]  HPI: Evelyn Webster is a 40 y.o. female  Chief Complaint  Patient presents with   Wound Check    Follow up, patient states area is closing up. Patient states she has a new area on the back of her left leg. Has been there for a couple of days.   SKIN INFECTION Duration: days Location: back of her L leg History of trauma in area: no Pain: yes Quality: aching and tight Severity: moderateH3 Redness: yes Swelling: yes Oozing: no Pus: no Fevers: no Nausea/vomiting: no Status: worse Treatments attempted:warm compresses  Tetanus: UTD  ANXIETY/DEPRESSION- Did not get her zyprexa as the pharmacy said they didn't get her Rx Duration: chronic Status:exacerbated Anxious mood: yes  Excessive worrying: yes Irritability: yes  Sweating: no Nausea: no Palpitations:no Hyperventilation: no Panic attacks: yes Agoraphobia: yes  Obscessions/compulsions: no Depressed mood: yes Depression screen [facility] 2/9 08/24/2020 07/23/2020 07/19/2020 04/20/2020 04/21/2019  Decreased Interest 0 0 0 0 0  Down, Depressed, Hopeless 0 0 0 0 0  PHQ - 2 Score 0 0 0 0 0  Altered sleeping 0 0 1 - 0  Tired, decreased energy 0 0 1 - 1  Change in appetite 0 0 0 - 0  Feeling bad or failure about yourself  0 0 0 - 0  Trouble concentrating 0 0 0 - 0  Moving slowly or fidgety/restless 0 0 0 - 0  Suicidal thoughts 0 0 0 - 0  PHQ-9 Score 0 0 2 - 1  Difficult doing work/chores Not difficult at all - Not difficult at all - Not difficult at all  Some recent data might be hidden   GAD 7 : Generalized Anxiety Score 08/24/2020 07/23/2020 07/19/2020 04/21/2019  Nervous, Anxious, on Edge 1 2 2 1   Control/stop worrying 1 2 2  0  Worry too much - different things 1 2 2  0  Trouble relaxing 1 0 2 1  Restless 1 0 2 0  Easily annoyed or irritable 1 0 2 2   Afraid - awful might happen 1 0 2 0  Total GAD 7 Score 7 6 14 4   Anxiety Difficulty Somewhat difficult Not difficult at all Somewhat difficult Somewhat difficult   Anhedonia: no Weight changes: no Insomnia: no   Hypersomnia: no Fatigue/loss of energy: yes Feelings of worthlessness: yes Feelings of guilt: no Impaired concentration/indecisiveness: no Suicidal ideations: no  Crying spells: yes Recent Stressors/Life Changes: yes   Relationship problems: no   Family stress: yes     Financial stress: no    Job stress: yes    Recent death/loss: yes  Relevant past medical, surgical, family and social history reviewed and updated as indicated. Interim medical history since our last visit reviewed. Allergies and medications reviewed and updated.  Review of Systems  Constitutional: Negative.   Respiratory: Negative.    Cardiovascular: Negative.   Gastrointestinal: Negative.   Musculoskeletal: Negative.   Skin: Negative.   Psychiatric/Behavioral:  Positive for decreased concentration and dysphoric mood. Negative for agitation, behavioral problems, confusion, hallucinations, self-injury, sleep disturbance and suicidal ideas. The patient is nervous/anxious. The patient is not hyperactive.    Per HPI unless specifically indicated above     Objective:    BP 110/76   Pulse 87  Wt 220 lb (99.8 kg)   SpO2 98%   BMI 41.57 kg/m   Wt Readings from Last 3 Encounters:  08/24/20 220 lb (99.8 kg)  08/16/20 220 lb 9.6 oz (100.1 kg)  08/13/20 220 lb (99.8 kg)    Physical Exam Vitals and nursing note reviewed.  Constitutional:      General: She is not in acute distress.    Appearance: Normal appearance. She is not ill-appearing, toxic-appearing or diaphoretic.  HENT:     Head: Normocephalic and atraumatic.     Right Ear: External ear normal.     Left Ear: External ear normal.     Nose: Nose normal.     Mouth/Throat:     Mouth: Mucous membranes are moist.     Pharynx: Oropharynx is  clear.  Eyes:     General: No scleral icterus.       Right eye: No discharge.        Left eye: No discharge.     Extraocular Movements: Extraocular movements intact.     Conjunctiva/sclera: Conjunctivae normal.     Pupils: Pupils are equal, round, and reactive to light.  Cardiovascular:     Rate and Rhythm: Normal rate and regular rhythm.     Pulses: Normal pulses.     Heart sounds: Normal heart sounds. No murmur heard.   No friction rub. No gallop.  Pulmonary:     Effort: Pulmonary effort is normal. No respiratory distress.     Breath sounds: Normal breath sounds. No stridor. No wheezing, rhonchi or rales.  Chest:     Chest wall: No tenderness.  Musculoskeletal:        General: Normal range of motion.     Cervical back: Normal range of motion and neck supple.  Skin:    General: Skin is warm and dry.     Capillary Refill: Capillary refill takes less than 2 seconds.     Coloration: Skin is not jaundiced or pale.     Findings: No bruising, erythema, lesion or rash.       Neurological:     General: No focal deficit present.     Mental Status: She is alert and oriented to person, place, and time. Mental status is at baseline.  Psychiatric:        Mood and Affect: Mood normal.        Behavior: Behavior normal.        Thought Content: Thought content normal.        Judgment: Judgment normal.    Results for orders placed or performed in visit on 07/23/20  Hepatitis C Antibody  Result Value Ref Range   Hep C Virus Ab <0.1 0.0 - 0.9 s/co ratio  CBC with Differential/Platelet  Result Value Ref Range   WBC 9.9 3.4 - 10.8 x10E3/uL   RBC 4.59 3.77 - 5.28 x10E6/uL   Hemoglobin 13.3 11.1 - 15.9 g/dL   Hematocrit 41.1 34.0 - 46.6 %   MCV 90 79 - 97 fL   MCH 29.0 26.6 - 33.0 pg   MCHC 32.4 31.5 - 35.7 g/dL   RDW 13.3 11.7 - 15.4 %   Platelets 325 150 - 450 x10E3/uL   Neutrophils 56 Not Estab. %   Lymphs 33 Not Estab. %   Monocytes 7 Not Estab. %   Eos 3 Not Estab. %   Basos 1  Not Estab. %   Neutrophils Absolute 5.6 1.4 - 7.0 x10E3/uL   Lymphocytes Absolute 3.3 (H) 0.7 -  3.1 x10E3/uL   Monocytes Absolute 0.7 0.1 - 0.9 x10E3/uL   EOS (ABSOLUTE) 0.3 0.0 - 0.4 x10E3/uL   Basophils Absolute 0.1 0.0 - 0.2 x10E3/uL   Immature Granulocytes 0 Not Estab. %   Immature Grans (Abs) 0.0 0.0 - 0.1 x10E3/uL  Comprehensive metabolic panel  Result Value Ref Range   Glucose 181 (H) 65 - 99 mg/dL   BUN 9 6 - 24 mg/dL   Creatinine, Ser 0.69 0.57 - 1.00 mg/dL   eGFR 112 >59 mL/min/1.73   BUN/Creatinine Ratio 13 9 - 23   Sodium 138 134 - 144 mmol/L   Potassium 4.2 3.5 - 5.2 mmol/L   Chloride 100 96 - 106 mmol/L   CO2 22 20 - 29 mmol/L   Calcium 8.9 8.7 - 10.2 mg/dL   Total Protein 7.0 6.0 - 8.5 g/dL   Albumin 3.9 3.8 - 4.8 g/dL   Globulin, Total 3.1 1.5 - 4.5 g/dL   Albumin/Globulin Ratio 1.3 1.2 - 2.2   Bilirubin Total 0.3 0.0 - 1.2 mg/dL   Alkaline Phosphatase 104 44 - 121 IU/L   AST 10 0 - 40 IU/L   ALT 16 0 - 32 IU/L  Lipid Panel w/o Chol/HDL Ratio  Result Value Ref Range   Cholesterol, Total 171 100 - 199 mg/dL   Triglycerides 188 (H) 0 - 149 mg/dL   HDL 33 (L) >39 mg/dL   VLDL Cholesterol Cal 33 5 - 40 mg/dL   LDL Chol Calc (NIH) 105 (H) 0 - 99 mg/dL  Urinalysis, Routine w reflex microscopic  Result Value Ref Range   Specific Gravity, UA 1.025 1.005 - 1.030   pH, UA 6.0 5.0 - 7.5   Color, UA Yellow Yellow   Appearance Ur Cloudy (A) Clear   Leukocytes,UA Negative Negative   Protein,UA Negative Negative/Trace   Glucose, UA Negative Negative   Ketones, UA Negative Negative   RBC, UA Negative Negative   Bilirubin, UA Negative Negative   Urobilinogen, Ur 0.2 0.2 - 1.0 mg/dL   Nitrite, UA Negative Negative  TSH  Result Value Ref Range   TSH 1.440 0.450 - 4.500 uIU/mL      Assessment & Plan:   Problem List Items Addressed This Visit       Other   Generalized anxiety disorder with panic attacks    Did not pick up her zyprexa as they didn't have it at  the pharmacy. Called to pharmacy. Confirmed receipt of her Rx. Will start it. Recheck 1 month.        Other Visit Diagnoses     Cellulitis of left leg    -  Primary   No sign of abscess- will treat with bactrim. Call if not getting better or getting worse.         Follow up plan: Return in about 4 weeks (around 09/21/2020).

## 2020-09-01 ENCOUNTER — Other Ambulatory Visit: Payer: Self-pay

## 2020-09-01 ENCOUNTER — Encounter: Payer: Self-pay | Admitting: Family Medicine

## 2020-09-01 ENCOUNTER — Ambulatory Visit (INDEPENDENT_AMBULATORY_CARE_PROVIDER_SITE_OTHER): Payer: PRIVATE HEALTH INSURANCE | Admitting: Family Medicine

## 2020-09-01 VITALS — BP 109/77 | HR 94 | Temp 98.4°F | Wt 217.2 lb

## 2020-09-01 DIAGNOSIS — R300 Dysuria: Secondary | ICD-10-CM

## 2020-09-01 DIAGNOSIS — M79605 Pain in left leg: Secondary | ICD-10-CM | POA: Diagnosis not present

## 2020-09-01 DIAGNOSIS — F411 Generalized anxiety disorder: Secondary | ICD-10-CM | POA: Diagnosis not present

## 2020-09-01 DIAGNOSIS — F410 Panic disorder [episodic paroxysmal anxiety] without agoraphobia: Secondary | ICD-10-CM

## 2020-09-01 DIAGNOSIS — B379 Candidiasis, unspecified: Secondary | ICD-10-CM

## 2020-09-01 DIAGNOSIS — Z636 Dependent relative needing care at home: Secondary | ICD-10-CM

## 2020-09-01 DIAGNOSIS — R609 Edema, unspecified: Secondary | ICD-10-CM

## 2020-09-01 LAB — URINALYSIS, ROUTINE W REFLEX MICROSCOPIC
Bilirubin, UA: NEGATIVE
Glucose, UA: NEGATIVE
Ketones, UA: NEGATIVE
Nitrite, UA: NEGATIVE
RBC, UA: NEGATIVE
Specific Gravity, UA: 1.025 (ref 1.005–1.030)
Urobilinogen, Ur: 0.2 mg/dL (ref 0.2–1.0)
pH, UA: 5.5 (ref 5.0–7.5)

## 2020-09-01 LAB — WET PREP FOR TRICH, YEAST, CLUE
Clue Cell Exam: NEGATIVE
Trichomonas Exam: NEGATIVE
Yeast Exam: POSITIVE — AB

## 2020-09-01 LAB — MICROSCOPIC EXAMINATION: Bacteria, UA: NONE SEEN

## 2020-09-01 MED ORDER — LIDOCAINE 5 % EX OINT: 1.0000 "application " | TOPICAL_OINTMENT | CUTANEOUS | 0 refills | Status: DC | PRN

## 2020-09-01 MED ORDER — FLUCONAZOLE 150 MG PO TABS: 150.0000 mg | ORAL_TABLET | Freq: Every day | ORAL | 0 refills | Status: DC

## 2020-09-01 NOTE — Patient Instructions (Signed)
Get FMLA paperwork from Work and drop it off

## 2020-09-01 NOTE — Progress Notes (Signed)
BP 109/77   Pulse 94   Temp 98.4 F (36.9 C)   Wt 217 lb 3.2 oz (98.5 kg)   SpO2 96%   BMI 41.04 kg/m    Subjective:    Patient ID: Evelyn Webster, female    DOB: [birth year], 40 y.o.   MRN: [id number]  HPI: Evelyn Webster is a 40 y.o. female  Chief Complaint  Patient presents with   Wound Check    Patient states area on her left thigh where she recently had drained has been hurting since Monday    Vaginal Discharge    Patient states she is having vaginal discharge and itchiness, also some burning with urination    VAGINAL DISCHARGE Duration: days Discharge description: white  Pruritus: yes Dysuria: no Malodorous: yes Urinary frequency: no Fevers: no Abdominal pain: no  Sexual activity: practicing safe sex History of sexually transmitted diseases: no Recent antibiotic use: yes Context: worse  Treatments attempted: none  The spot where she had her I&D on her L thigh has been hurting.   ANXIETY/STRESS- has been doing a lot worse. She notes that her mother has been very sick with both cancer and a recent stroke. She has been the only one caring for her and has been getting a lot of trouble for it at work as well.  Duration: chronic Status:exacerbated Anxious mood: yes  Excessive worrying: yes Irritability: yes  Sweating: yes Nausea: yes Palpitations:no Hyperventilation: yes Panic attacks: yes Agoraphobia: no  Obscessions/compulsions: yes Depressed mood: yes Depression screen [facility] 2/9 08/24/2020 07/23/2020 07/19/2020 04/20/2020 04/21/2019  Decreased Interest 0 0 0 0 0  Down, Depressed, Hopeless 0 0 0 0 0  PHQ - 2 Score 0 0 0 0 0  Altered sleeping 0 0 1 - 0  Tired, decreased energy 0 0 1 - 1  Change in appetite 0 0 0 - 0  Feeling bad or failure about yourself  0 0 0 - 0  Trouble concentrating 0 0 0 - 0  Moving slowly or fidgety/restless 0 0 0 - 0  Suicidal thoughts 0 0 0 - 0  PHQ-9 Score 0 0 2 - 1  Difficult doing work/chores Not difficult at all - Not  difficult at all - Not difficult at all  Some recent data might be hidden   GAD 7 : Generalized Anxiety Score 08/24/2020 07/23/2020 07/19/2020 04/21/2019  Nervous, Anxious, on Edge 1 2 2 1   Control/stop worrying 1 2 2  0  Worry too much - different things 1 2 2  0  Trouble relaxing 1 0 2 1  Restless 1 0 2 0  Easily annoyed or irritable 1 0 2 2  Afraid - awful might happen 1 0 2 0  Total GAD 7 Score 7 6 14 4   Anxiety Difficulty Somewhat difficult Not difficult at all Somewhat difficult Somewhat difficult   Anhedonia: no Weight changes: no Insomnia: no   Hypersomnia: no Fatigue/loss of energy: yes Feelings of worthlessness: yes Feelings of guilt: yes Impaired concentration/indecisiveness: yes Suicidal ideations: no  Crying spells: no Recent Stressors/Life Changes: yes   Relationship problems: no   Family stress: yes     Financial stress: no    Job stress: yes    Recent death/loss: no   Relevant past medical, surgical, family and social history reviewed and updated as indicated. Interim medical history since our last visit reviewed. Allergies and medications reviewed and updated.  Review of Systems  Constitutional: Negative.   Respiratory: Negative.    Cardiovascular:  Positive for leg swelling.  Gastrointestinal: Negative.   Genitourinary:  Positive for vaginal discharge. Negative for decreased urine volume, difficulty urinating, dyspareunia, dysuria, enuresis, flank pain, frequency, genital sores, hematuria, menstrual problem, pelvic pain, urgency, vaginal bleeding and vaginal pain.  Musculoskeletal: Negative.   Skin: Negative.   Neurological: Negative.   Psychiatric/Behavioral:  Positive for decreased concentration and dysphoric mood. Negative for agitation, behavioral problems, confusion, hallucinations, self-injury, sleep disturbance and suicidal ideas. The patient is nervous/anxious. The patient is not hyperactive.    Per HPI unless specifically indicated above      Objective:    BP 109/77   Pulse 94   Temp 98.4 F (36.9 C)   Wt 217 lb 3.2 oz (98.5 kg)   SpO2 96%   BMI 41.04 kg/m   Wt Readings from Last 3 Encounters:  09/01/20 217 lb 3.2 oz (98.5 kg)  08/24/20 220 lb (99.8 kg)  08/16/20 220 lb 9.6 oz (100.1 kg)    Physical Exam Vitals and nursing note reviewed.  Constitutional:      General: She is not in acute distress.    Appearance: Normal appearance. She is not ill-appearing, toxic-appearing or diaphoretic.  HENT:     Head: Normocephalic and atraumatic.     Right Ear: External ear normal.     Left Ear: External ear normal.     Nose: Nose normal.     Mouth/Throat:     Mouth: Mucous membranes are moist.     Pharynx: Oropharynx is clear.  Eyes:     General: No scleral icterus.       Right eye: No discharge.        Left eye: No discharge.     Extraocular Movements: Extraocular movements intact.     Conjunctiva/sclera: Conjunctivae normal.     Pupils: Pupils are equal, round, and reactive to light.  Cardiovascular:     Rate and Rhythm: Normal rate and regular rhythm.     Pulses: Normal pulses.     Heart sounds: Normal heart sounds. No murmur heard.   No friction rub. No gallop.  Pulmonary:     Effort: Pulmonary effort is normal. No respiratory distress.     Breath sounds: Normal breath sounds. No stridor. No wheezing, rhonchi or rales.  Chest:     Chest wall: No tenderness.  Musculoskeletal:        General: Normal range of motion.     Cervical back: Normal range of motion and neck supple.  Skin:    General: Skin is warm and dry.     Capillary Refill: Capillary refill takes less than 2 seconds.     Coloration: Skin is not jaundiced or pale.     Findings: No bruising, erythema, lesion or rash.  Neurological:     General: No focal deficit present.     Mental Status: She is alert and oriented to person, place, and time. Mental status is at baseline.  Psychiatric:        Mood and Affect: Mood normal.        Behavior:  Behavior normal.        Thought Content: Thought content normal.        Judgment: Judgment normal.    Results for orders placed or performed in visit on 09/01/20  WET PREP FOR TRICH, YEAST, CLUE   Specimen: Vaginal Fluid   Vaginal Flui  Result Value Ref Range   Trichomonas Exam Negative Negative   Yeast Exam Positive (A) Negative   Clue  Cell Exam Negative Negative  Microscopic Examination   Urine  Result Value Ref Range   WBC, UA 0-5 0 - 5 /hpf   RBC 0-2 0 - 2 /hpf   Epithelial Cells (non renal) 0-10 0 - 10 /hpf   Mucus, UA Present Not Estab.   Bacteria, UA None seen None seen/Few  Urinalysis, Routine w reflex microscopic  Result Value Ref Range   Specific Gravity, UA 1.025 1.005 - 1.030   pH, UA 5.5 5.0 - 7.5   Color, UA Yellow Yellow   Appearance Ur Hazy (A) Clear   Leukocytes,UA 1+ (A) Negative   Protein,UA Trace (A) Negative/Trace   Glucose, UA Negative Negative   Ketones, UA Negative Negative   RBC, UA Negative Negative   Bilirubin, UA Negative Negative   Urobilinogen, Ur 0.2 0.2 - 1.0 mg/dL   Nitrite, UA Negative Negative   Microscopic Examination See below:       Assessment & Plan:   Problem List Items Addressed This Visit       Other   Generalized anxiety disorder with panic attacks - Primary    Would like to see psychiatry. Referral generated today. Call with any concerns.        Relevant Orders   Ambulatory referral to Psychiatry   Other Visit Diagnoses     Caregiver stress       Discussed that we will be happy to fill out FMLA forms for her- she will call if she would like to.    Relevant Orders   Ambulatory referral to Psychiatry   Yeast infection       Will treat with diflucan. Call with any concerns. Continue to monitor.    Relevant Medications   fluconazole (DIFLUCAN) 150 MG tablet   Pain of left lower extremity       Will treat with lidocaine ointment. Call with any concerns.    Peripheral edema       Will start on compression socks.  Call with any concerns or if not getting better.    Dysuria       UA clear, but + yeast.    Relevant Orders   Urinalysis, Routine w reflex microscopic (Completed)   WET PREP FOR TRICH, YEAST, CLUE (Completed)        Follow up plan: Return in about 4 weeks (around 09/29/2020).

## 2020-09-06 ENCOUNTER — Encounter: Payer: Self-pay | Admitting: Family Medicine

## 2020-09-06 NOTE — Assessment & Plan Note (Signed)
Would like to see psychiatry. Referral generated today. Call with any concerns.  

## 2020-09-08 ENCOUNTER — Other Ambulatory Visit: Payer: Self-pay

## 2020-09-08 ENCOUNTER — Encounter: Payer: Self-pay | Admitting: Family Medicine

## 2020-09-08 ENCOUNTER — Ambulatory Visit (INDEPENDENT_AMBULATORY_CARE_PROVIDER_SITE_OTHER): Payer: PRIVATE HEALTH INSURANCE | Admitting: Family Medicine

## 2020-09-08 VITALS — BP 114/78 | HR 80 | Temp 98.2°F | Wt 217.0 lb

## 2020-09-08 DIAGNOSIS — N611 Abscess of the breast and nipple: Secondary | ICD-10-CM | POA: Diagnosis not present

## 2020-09-08 DIAGNOSIS — E1165 Type 2 diabetes mellitus with hyperglycemia: Secondary | ICD-10-CM | POA: Diagnosis not present

## 2020-09-08 DIAGNOSIS — R300 Dysuria: Secondary | ICD-10-CM | POA: Diagnosis not present

## 2020-09-08 LAB — MICROSCOPIC EXAMINATION
Bacteria, UA: NONE SEEN
RBC, Urine: >30 /hpf — ABNORMAL HIGH (ref 0–2)
WBC, UA: NONE SEEN /hpf (ref 0–5)

## 2020-09-08 LAB — URINALYSIS, ROUTINE W REFLEX MICROSCOPIC
Bilirubin, UA: NEGATIVE
Glucose, UA: NEGATIVE
Leukocytes,UA: NEGATIVE
Nitrite, UA: NEGATIVE
Specific Gravity, UA: 1.025 (ref 1.005–1.030)
Urobilinogen, Ur: 0.2 mg/dL (ref 0.2–1.0)
pH, UA: 5.5 (ref 5.0–7.5)

## 2020-09-08 LAB — BAYER DCA HB A1C WAIVED: HB A1C (BAYER DCA - WAIVED): 7.2 % — ABNORMAL HIGH (ref <7.0)

## 2020-09-08 LAB — WET PREP FOR TRICH, YEAST, CLUE
Clue Cell Exam: NEGATIVE
Trichomonas Exam: NEGATIVE
Yeast Exam: NEGATIVE

## 2020-09-08 MED ORDER — SULFAMETHOXAZOLE-TRIMETHOPRIM 800-160 MG PO TABS: 1.0000 | ORAL_TABLET | Freq: Two times a day (BID) | ORAL | 0 refills | Status: DC

## 2020-09-08 MED ORDER — METFORMIN HCL ER 500 MG PO TB24: 500.0000 mg | ORAL_TABLET | Freq: Every day | ORAL | 3 refills | Status: DC

## 2020-09-08 NOTE — Progress Notes (Signed)
BP 114/78   Pulse 80   Temp 98.2 F (36.8 C)   Wt 217 lb (98.4 kg)   SpO2 96%   BMI 41.00 kg/m    Subjective:    Patient ID: Evelyn Webster, female    DOB: 1980-07-18, 40 y.o.   MRN: [id number]  HPI: Evelyn Webster is a 40 y.o. female  Chief Complaint  Patient presents with   Cyst    Patient states she has an area on her left breast that has already opened. Area is sore.    Vaginal Discharge    Patient states she is still having vaginal discharge and some pain with urination    ABSCESS- is already drainging, but this is the 3rd time she's had one in this same spot Duration:  couple of days Location: L breast Pain:  yes Quality:  aching and sore Severity: moderate Redness:  yes Swelling:  yes Warmth:  yes Oozing:  yes Pus:  yes Treatments attempted:warm compresses Past similar infections:  yes Past MRSA skin infections:  yes History of trauma in area:  no Fevers:  no Nausea/vomiting:  no  VAGINAL DISCHARGE Duration: weeks Discharge description: clear  Pruritus: yes Dysuria: yes Malodorous: yes Urinary frequency: yes Fevers: no Abdominal pain: no  Sexual activity: practicing safe sex History of sexually transmitted diseases: no Recent antibiotic use: yes Context: recurrent yeast infections  Treatments attempted: antifungal  Relevant past medical, surgical, family and social history reviewed and updated as indicated. Interim medical history since our last visit reviewed. Allergies and medications reviewed and updated.  Review of Systems  Constitutional: Negative.   Respiratory: Negative.    Cardiovascular: Negative.   Gastrointestinal: Negative.   Genitourinary:  Positive for dysuria, frequency and vaginal discharge. Negative for decreased urine volume, difficulty urinating, dyspareunia, enuresis, flank pain, genital sores, hematuria, menstrual problem, pelvic pain, urgency, vaginal bleeding and vaginal pain.  Musculoskeletal: Negative.    Psychiatric/Behavioral: Negative.     Per HPI unless specifically indicated above     Objective:    BP 114/78   Pulse 80   Temp 98.2 F (36.8 C)   Wt 217 lb (98.4 kg)   SpO2 96%   BMI 41.00 kg/m   Wt Readings from Last 3 Encounters:  09/08/20 217 lb (98.4 kg)  09/01/20 217 lb 3.2 oz (98.5 kg)  08/24/20 220 lb (99.8 kg)    Physical Exam Vitals and nursing note reviewed.  Constitutional:      General: She is not in acute distress.    Appearance: Normal appearance. She is not ill-appearing, toxic-appearing or diaphoretic.  HENT:     Head: Normocephalic and atraumatic.     Right Ear: External ear normal.     Left Ear: External ear normal.     Nose: Nose normal.     Mouth/Throat:     Mouth: Mucous membranes are moist.     Pharynx: Oropharynx is clear.  Eyes:     General: No scleral icterus.       Right eye: No discharge.        Left eye: No discharge.     Extraocular Movements: Extraocular movements intact.     Conjunctiva/sclera: Conjunctivae normal.     Pupils: Pupils are equal, round, and reactive to light.  Cardiovascular:     Rate and Rhythm: Normal rate and regular rhythm.     Pulses: Normal pulses.     Heart sounds: Normal heart sounds. No murmur heard.   No friction rub. No gallop.  Pulmonary:     Effort: Pulmonary effort is normal. No respiratory distress.     Breath sounds: Normal breath sounds. No stridor. No wheezing, rhonchi or rales.  Chest:     Chest wall: No tenderness.  Musculoskeletal:        General: Normal range of motion.     Cervical back: Normal range of motion and neck supple.  Skin:    General: Skin is warm and dry.     Capillary Refill: Capillary refill takes less than 2 seconds.     Coloration: Skin is not jaundiced or pale.     Findings: No bruising, erythema, lesion or rash.  Neurological:     General: No focal deficit present.     Mental Status: She is alert and oriented to person, place, and time. Mental status is at baseline.   Psychiatric:        Mood and Affect: Mood normal.        Behavior: Behavior normal.        Thought Content: Thought content normal.        Judgment: Judgment normal.    Results for orders placed or performed in visit on 09/08/20  WET PREP FOR TRICH, YEAST, CLUE   Specimen: Sterile Swab   Sterile Swab  Result Value Ref Range   Trichomonas Exam Negative Negative   Yeast Exam Negative Negative   Clue Cell Exam Negative Negative  Microscopic Examination   Urine  Result Value Ref Range   WBC, UA None seen 0 - 5 /hpf   RBC >30 (H) 0 - 2 /hpf   Epithelial Cells (non renal) 0-10 0 - 10 /hpf   Mucus, UA Present (A) Not Estab.   Bacteria, UA None seen None seen/Few  Urinalysis, Routine w reflex microscopic  Result Value Ref Range   Specific Gravity, UA 1.025 1.005 - 1.030   pH, UA 5.5 5.0 - 7.5   Color, UA Red (A) Yellow   Appearance Ur Cloudy (A) Clear   Leukocytes,UA Negative Negative   Protein,UA 2+ (A) Negative/Trace   Glucose, UA Negative Negative   Ketones, UA Trace (A) Negative   RBC, UA 3+ (A) Negative   Bilirubin, UA Negative Negative   Urobilinogen, Ur 0.2 0.2 - 1.0 mg/dL   Nitrite, UA Negative Negative   Microscopic Examination See below:   Bayer DCA Hb A1c Waived  Result Value Ref Range   HB A1C (BAYER DCA - WAIVED) 7.2 (H) <7.0 %      Assessment & Plan:   Problem List Items Addressed This Visit       Endocrine   Type 2 diabetes mellitus with hyperglycemia, without long-term current use of insulin (HCC)    Newly diagnosed today with A1c of 7.2- will start metformin and recheck 1 month. Call with any concerns.        Relevant Medications   metFORMIN (GLUCOPHAGE-XR) 500 MG 24 hr tablet   Other Visit Diagnoses     Breast abscess    -  Primary   Has already drained, but this is the 3rd time it's shown up in same spot- will get her into surgery to see if it needs to be excised. Treat with bactrim.    Relevant Orders   Ambulatory referral to General Surgery    Dysuria       + glucose. Newly diagnosed DM. Likely the cause. Will treat DM and recheck 1 month.    Relevant Orders   Urinalysis, Routine w reflex  microscopic (Completed)   WET PREP FOR TRICH, YEAST, CLUE (Completed)   Bayer DCA Hb A1c Waived (Completed)        Follow up plan: Return in about 4 weeks (around 10/06/2020).

## 2020-09-09 ENCOUNTER — Encounter: Payer: Self-pay | Admitting: Family Medicine

## 2020-09-09 DIAGNOSIS — E1165 Type 2 diabetes mellitus with hyperglycemia: Secondary | ICD-10-CM | POA: Insufficient documentation

## 2020-09-09 NOTE — Assessment & Plan Note (Signed)
Newly diagnosed today with A1c of 7.2- will start metformin and recheck 1 month. Call with any concerns.

## 2020-09-21 ENCOUNTER — Ambulatory Visit: Payer: PRIVATE HEALTH INSURANCE | Admitting: Family Medicine

## 2020-10-05 ENCOUNTER — Ambulatory Visit: Payer: PRIVATE HEALTH INSURANCE | Admitting: Family Medicine

## 2020-11-23 ENCOUNTER — Encounter: Payer: Self-pay | Admitting: Family Medicine

## 2020-11-23 ENCOUNTER — Other Ambulatory Visit: Payer: Self-pay

## 2020-11-23 ENCOUNTER — Ambulatory Visit (INDEPENDENT_AMBULATORY_CARE_PROVIDER_SITE_OTHER): Payer: PRIVATE HEALTH INSURANCE | Admitting: Family Medicine

## 2020-11-23 VITALS — BP 112/79 | HR 84 | Temp 98.0°F | Resp 16 | Ht 61.0 in | Wt 221.0 lb

## 2020-11-23 DIAGNOSIS — L0291 Cutaneous abscess, unspecified: Secondary | ICD-10-CM

## 2020-11-23 DIAGNOSIS — F4321 Adjustment disorder with depressed mood: Secondary | ICD-10-CM

## 2020-11-23 MED ORDER — SULFAMETHOXAZOLE-TRIMETHOPRIM 800-160 MG PO TABS
1.0000 | ORAL_TABLET | Freq: Two times a day (BID) | ORAL | 0 refills | Status: DC
Start: 2020-11-23 — End: 2020-12-23

## 2020-11-23 NOTE — Progress Notes (Signed)
BP 112/79 (BP Location: Left Arm, Patient Position: Sitting, Cuff Size: Large)   Pulse 84   Temp 98 F (36.7 C) (Oral)   Resp 16   Ht 5\' 1"  (1.549 m)   Wt 221 lb (100.2 kg)   LMP 10/29/2020 (Approximate)   SpO2 98%   BMI 41.76 kg/m    Subjective:    Patient ID: 10/31/2020, female    DOB: [birth year], 40 y.o.   MRN: [id number]  HPI: Evelyn Webster is a 40 y.o. female  Chief Complaint  Patient presents with   Mass    Under right axilla x 6 days.    SKIN INFECTION Duration:  6 days Location: bilateral axilla History of trauma in area: no Pain: yes Quality: aching Severity: moderate Redness: yes Swelling: yes Oozing: yes Pus: yes Fevers: no Nausea/vomiting: no Status: better Treatments attempted:warm compresses  Tetanus: UTD  Relevant past medical, surgical, family and social history reviewed and updated as indicated. Interim medical history since our last visit reviewed. Allergies and medications reviewed and updated.  Review of Systems  Constitutional: Negative.   Respiratory: Negative.    Cardiovascular: Negative.   Gastrointestinal: Negative.   Musculoskeletal: Negative.   Skin:  Positive for wound. Negative for color change, pallor and rash.  Hematological: Negative.   Psychiatric/Behavioral:  Positive for dysphoric mood. Negative for agitation, behavioral problems, confusion, decreased concentration, hallucinations, self-injury, sleep disturbance and suicidal ideas. The patient is nervous/anxious. The patient is not hyperactive.    Per HPI unless specifically indicated above     Objective:    BP 112/79 (BP Location: Left Arm, Patient Position: Sitting, Cuff Size: Large)   Pulse 84   Temp 98 F (36.7 C) (Oral)   Resp 16   Ht 5\' 1"  (1.549 m)   Wt 221 lb (100.2 kg)   LMP 10/29/2020 (Approximate)   SpO2 98%   BMI 41.76 kg/m   Wt Readings from Last 3 Encounters:  11/23/20 221 lb (100.2 kg)  09/08/20 217 lb (98.4 kg)  09/01/20 217 lb 3.2 oz  (98.5 kg)    Physical Exam Vitals and nursing note reviewed.  Constitutional:      General: She is not in acute distress.    Appearance: Normal appearance. She is not ill-appearing, toxic-appearing or diaphoretic.  HENT:     Head: Normocephalic and atraumatic.     Right Ear: External ear normal.     Left Ear: External ear normal.     Nose: Nose normal.     Mouth/Throat:     Mouth: Mucous membranes are moist.     Pharynx: Oropharynx is clear.  Eyes:     General: No scleral icterus.       Right eye: No discharge.        Left eye: No discharge.     Extraocular Movements: Extraocular movements intact.     Conjunctiva/sclera: Conjunctivae normal.     Pupils: Pupils are equal, round, and reactive to light.  Cardiovascular:     Rate and Rhythm: Normal rate and regular rhythm.     Pulses: Normal pulses.     Heart sounds: Normal heart sounds. No murmur heard.   No friction rub. No gallop.  Pulmonary:     Effort: Pulmonary effort is normal. No respiratory distress.     Breath sounds: Normal breath sounds. No stridor. No wheezing, rhonchi or rales.  Chest:     Chest wall: No tenderness.  Musculoskeletal:        General: Normal  range of motion.     Cervical back: Normal range of motion and neck supple.  Skin:    General: Skin is warm and dry.     Capillary Refill: Capillary refill takes less than 2 seconds.     Coloration: Skin is not jaundiced or pale.     Findings: No bruising, erythema, lesion or rash.     Comments: Draining abscess under R arm, irritated lump under L arm  Neurological:     General: No focal deficit present.     Mental Status: She is alert and oriented to person, place, and time. Mental status is at baseline.  Psychiatric:        Mood and Affect: Mood normal.        Behavior: Behavior normal.        Thought Content: Thought content normal.        Judgment: Judgment normal.    Results for orders placed or performed in visit on 09/08/20  WET PREP FOR TRICH,  YEAST, CLUE   Specimen: Sterile Swab   Sterile Swab  Result Value Ref Range   Trichomonas Exam Negative Negative   Yeast Exam Negative Negative   Clue Cell Exam Negative Negative  Microscopic Examination   Urine  Result Value Ref Range   WBC, UA None seen 0 - 5 /hpf   RBC >30 (H) 0 - 2 /hpf   Epithelial Cells (non renal) 0-10 0 - 10 /hpf   Mucus, UA Present (A) Not Estab.   Bacteria, UA None seen None seen/Few  Urinalysis, Routine w reflex microscopic  Result Value Ref Range   Specific Gravity, UA 1.025 1.005 - 1.030   pH, UA 5.5 5.0 - 7.5   Color, UA Red (A) Yellow   Appearance Ur Cloudy (A) Clear   Leukocytes,UA Negative Negative   Protein,UA 2+ (A) Negative/Trace   Glucose, UA Negative Negative   Ketones, UA Trace (A) Negative   RBC, UA 3+ (A) Negative   Bilirubin, UA Negative Negative   Urobilinogen, Ur 0.2 0.2 - 1.0 mg/dL   Nitrite, UA Negative Negative   Microscopic Examination See below:   Bayer DCA Hb A1c Waived  Result Value Ref Range   HB A1C (BAYER DCA - WAIVED) 7.2 (H) <7.0 %      Assessment & Plan:   Problem List Items Addressed This Visit   None Visit Diagnoses     Abscess    -  Primary   Already draining. No need for I&D- will treat with bactrim. Call if not getting better or getting worse.    Grief       Discussed breathing techniques. Will get her in touch with LCSW. Call with any concerns.    Relevant Orders   AMB Referral to [facility] Coordinaton        Follow up plan: Return November 6 month follow up.

## 2020-11-23 NOTE — Patient Instructions (Signed)
The Mindfulness App.  ?Headspace.  ?Calm.  ?MINDBODY.  ?Buddhify.  ?Insight Timer.  ?Smiling Mind.  ?Meditation Timer Pro.  ?Aura  ? ?

## 2020-11-24 ENCOUNTER — Telehealth: Payer: Self-pay

## 2020-11-24 NOTE — Chronic Care Management (AMB) (Signed)
Care Management   Outreach Note  11/24/2020 Name: Evelyn Webster MRN: [id number] DOB: May 28, 1980  Referred by: Dorcas Carrow, DO Reason for referral : Care Coordination (Outreach to schedule referral for LCSW)   An unsuccessful telephone outreach was attempted today. The patient was referred to the case management team for assistance with care management and care coordination.   Follow Up Plan:  The care management team will reach out to the patient again over the next 3 days.  If patient returns call to provider office, please advise to call Embedded Care Management Care Guide Penne Lash  at [phone]  Penne Lash, RMA Care Guide, Embedded Care Coordination [facility]  [place], [place] [address] Direct Dial: [phone] [email or web address]

## 2020-12-01 NOTE — Chronic Care Management (AMB) (Signed)
Care Management   Outreach Note  12/01/2020 Name: Vernell Falconer MRN: [id number] DOB: 12/20/1980  Referred by: Dorcas Carrow, DO Reason for referral : Care Coordination (Outreach to schedule referral for LCSW)   A second unsuccessful telephone outreach was attempted today. The patient was referred to the case management team for assistance with care management and care coordination.   Follow Up Plan:  The care management team will reach out to the patient again over the next 5 days.  If patient returns call to provider office, please advise to call Embedded Care Management Care Guide Penne Lash  at [phone]  Penne Lash, RMA Care Guide, Embedded Care Coordination [facility]  [place], [place] [address] Direct Dial: [phone] [email or web address]: [email or web address]

## 2020-12-06 NOTE — Chronic Care Management (AMB) (Signed)
Care Management   Outreach Note  12/06/2020 Name: Evelyn Webster MRN: [id number] DOB: 1980-07-18  Referred by: Dorcas Carrow, DO Reason for referral : Care Coordination (Outreach to schedule referral for LCSW)   Third unsuccessful telephone outreach was attempted today. The patient was referred to the case management team for assistance with care management and care coordination. The patient's primary care provider has been notified of our unsuccessful attempts to make or maintain contact with the patient. The care management team is pleased to engage with this patient at any time in the future should he/she be interested in assistance from the care management team.   Follow Up Plan:  We have been unable to make contact with the patient . The care management team is available to follow up with the patient after provider conversation with the patient regarding recommendation for care management engagement and subsequent re-referral to the care management team.   Penne Lash, RMA Care Guide, Embedded Care Coordination [facility]  [place], [place] [address] Direct Dial: [phone] [email or web address]
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
## 2020-12-23 ENCOUNTER — Other Ambulatory Visit: Payer: Self-pay

## 2020-12-23 ENCOUNTER — Ambulatory Visit (INDEPENDENT_AMBULATORY_CARE_PROVIDER_SITE_OTHER): Payer: PRIVATE HEALTH INSURANCE | Admitting: Nurse Practitioner

## 2020-12-23 ENCOUNTER — Encounter: Payer: Self-pay | Admitting: Nurse Practitioner

## 2020-12-23 VITALS — BP 107/77 | HR 74 | Temp 97.7°F | Wt 222.4 lb

## 2020-12-23 DIAGNOSIS — K219 Gastro-esophageal reflux disease without esophagitis: Secondary | ICD-10-CM

## 2020-12-23 MED ORDER — OMEPRAZOLE 40 MG PO CPDR: 40.0000 mg | DELAYED_RELEASE_CAPSULE | Freq: Every day | ORAL | 3 refills | Status: AC

## 2020-12-23 NOTE — Assessment & Plan Note (Signed)
Chronic, not controlled. She ran out of omeprazole and has been taking OTC nexium. Will refill omeprazole and discontinue nexium. Discussed eating small, more frequent meals. She notes that greasy foods like pizza tend to make her symptoms worse. Discussed avoiding triggers. Don't eat and then lay down. Discussed smoking cessation and she is interested quitting in the near future. Follow up if symptoms not improving or any concerns. Keep next scheduled appointment next month with PCP.

## 2020-12-28 ENCOUNTER — Ambulatory Visit: Payer: Self-pay

## 2020-12-28 NOTE — Telephone Encounter (Signed)
Unable to reach pt. LAst two attempts "Not available to take call." Three attempts made to reach pt, routing to provider for resolution per protocol.

## 2020-12-28 NOTE — Telephone Encounter (Signed)
Pt called to get an appt/ she stated she has a painful boil on her thigh / please advise if needed / pt has been scheduled to come in tomorrow     Message on phone states person is not available, unable to leave message.

## 2020-12-28 NOTE — Telephone Encounter (Signed)
Attempted to reach pt, "Call cannot be completed at this time." Second attempt.

## 2020-12-29 ENCOUNTER — Other Ambulatory Visit: Payer: Self-pay

## 2020-12-29 ENCOUNTER — Encounter: Payer: Self-pay | Admitting: Nurse Practitioner

## 2020-12-29 ENCOUNTER — Ambulatory Visit (INDEPENDENT_AMBULATORY_CARE_PROVIDER_SITE_OTHER): Payer: PRIVATE HEALTH INSURANCE | Admitting: Nurse Practitioner

## 2020-12-29 VITALS — BP 114/71 | HR 77 | Ht 61.0 in | Wt 221.4 lb

## 2020-12-29 DIAGNOSIS — M79645 Pain in left finger(s): Secondary | ICD-10-CM | POA: Diagnosis not present

## 2020-12-29 DIAGNOSIS — L0390 Cellulitis, unspecified: Secondary | ICD-10-CM

## 2020-12-29 MED ORDER — SULFAMETHOXAZOLE-TRIMETHOPRIM 800-160 MG PO TABS: 1.0000 | ORAL_TABLET | Freq: Two times a day (BID) | ORAL | 0 refills | Status: DC

## 2020-12-29 NOTE — Telephone Encounter (Signed)
Patient has appointment scheduled for 12/29/2020.

## 2020-12-29 NOTE — Progress Notes (Signed)
BP 114/71   Pulse 77   Ht 5\' 1"  (1.549 m)   Wt 221 lb 6.4 oz (100.4 kg)   SpO2 100%   BMI 41.83 kg/m    Subjective:    Patient ID: , female    DOB: May 05, 1980, 40 y.o.   MRN: [id number]  HPI: Evelyn Webster is a 40 y.o. female  Chief Complaint  Patient presents with   Abscess   Patient presents to clinic with complaints of an abscess on her left thigh.  Patient states it has been present for 2 weeks.  Was not draining at first but states it has a head on it now and draining a little bit.  Denies any fever.  It is painful.   Patient got her pinky caught on her purse and she felt her finger pop and feels like she might've done something to it.     Relevant past medical, surgical, family and social history reviewed and updated as indicated. Interim medical history since our last visit reviewed. Allergies and medications reviewed and updated.  Review of Systems  Musculoskeletal:        Pinky pain  Skin:        Left thigh abscess   Per HPI unless specifically indicated above     Objective:    BP 114/71   Pulse 77   Ht 5\' 1"  (1.549 m)   Wt 221 lb 6.4 oz (100.4 kg)   SpO2 100%   BMI 41.83 kg/m   Wt Readings from Last 3 Encounters:  12/29/20 221 lb 6.4 oz (100.4 kg)  12/23/20 222 lb 6.4 oz (100.9 kg)  11/23/20 221 lb (100.2 kg)    Physical Exam Vitals and nursing note reviewed.  Constitutional:      General: She is not in acute distress.    Appearance: Normal appearance. She is normal weight. She is not ill-appearing, toxic-appearing or diaphoretic.  HENT:     Head: Normocephalic.     Right Ear: External ear normal.     Left Ear: External ear normal.     Nose: Nose normal.     Mouth/Throat:     Mouth: Mucous membranes are moist.     Pharynx: Oropharynx is clear.  Eyes:     General:        Right eye: No discharge.        Left eye: No discharge.     Extraocular Movements: Extraocular movements intact.     Conjunctiva/sclera: Conjunctivae  normal.     Pupils: Pupils are equal, round, and reactive to light.  Cardiovascular:     Rate and Rhythm: Normal rate and regular rhythm.     Heart sounds: No murmur heard. Pulmonary:     Effort: Pulmonary effort is normal. No respiratory distress.     Breath sounds: Normal breath sounds. No wheezing or rales.  Musculoskeletal:     Cervical back: Normal range of motion and neck supple.  Skin:    General: Skin is warm and dry.     Capillary Refill: Capillary refill takes less than 2 seconds.       Neurological:     General: No focal deficit present.     Mental Status: She is alert and oriented to person, place, and time. Mental status is at baseline.  Psychiatric:        Mood and Affect: Mood normal.        Behavior: Behavior normal.        Thought Content:  Thought content normal.        Judgment: Judgment normal.    Results for orders placed or performed in visit on 09/08/20  WET PREP FOR TRICH, YEAST, CLUE   Specimen: Sterile Swab   Sterile Swab  Result Value Ref Range   Trichomonas Exam Negative Negative   Yeast Exam Negative Negative   Clue Cell Exam Negative Negative  Microscopic Examination   Urine  Result Value Ref Range   WBC, UA None seen 0 - 5 /hpf   RBC >30 (H) 0 - 2 /hpf   Epithelial Cells (non renal) 0-10 0 - 10 /hpf   Mucus, UA Present (A) Not Estab.   Bacteria, UA None seen None seen/Few  Urinalysis, Routine w reflex microscopic  Result Value Ref Range   Specific Gravity, UA 1.025 1.005 - 1.030   pH, UA 5.5 5.0 - 7.5   Color, UA Red (A) Yellow   Appearance Ur Cloudy (A) Clear   Leukocytes,UA Negative Negative   Protein,UA 2+ (A) Negative/Trace   Glucose, UA Negative Negative   Ketones, UA Trace (A) Negative   RBC, UA 3+ (A) Negative   Bilirubin, UA Negative Negative   Urobilinogen, Ur 0.2 0.2 - 1.0 mg/dL   Nitrite, UA Negative Negative   Microscopic Examination See below:   Bayer DCA Hb A1c Waived  Result Value Ref Range   HB A1C (BAYER DCA -  WAIVED) 7.2 (H) <7.0 %      Assessment & Plan:   Problem List Items Addressed This Visit   None Visit Diagnoses     Pain of finger of left hand    -  Primary   Xray ordered to evaluate finger. Will make recommendations based on imaging results.    Relevant Orders   DG Hand Complete Left   Recurrent cellulitis       No evidence of abscess. Will treat with bactrim. Area is already open and draining. FU if symptoms worsen or fail to improve.   Relevant Orders   Ambulatory referral to Dermatology        Follow up plan: Return if symptoms worsen or fail to improve.

## 2021-01-19 ENCOUNTER — Ambulatory Visit: Payer: PRIVATE HEALTH INSURANCE | Admitting: Family Medicine

## 2021-02-03 ENCOUNTER — Encounter: Payer: Self-pay | Admitting: Internal Medicine

## 2021-02-03 ENCOUNTER — Ambulatory Visit (INDEPENDENT_AMBULATORY_CARE_PROVIDER_SITE_OTHER): Payer: PRIVATE HEALTH INSURANCE | Admitting: Internal Medicine

## 2021-02-03 ENCOUNTER — Other Ambulatory Visit: Payer: Self-pay

## 2021-02-03 ENCOUNTER — Telehealth: Payer: Self-pay | Admitting: Family Medicine

## 2021-02-03 VITALS — BP 112/78 | HR 109 | Temp 98.5°F | Ht 61.02 in | Wt 218.4 lb

## 2021-02-03 DIAGNOSIS — R0981 Nasal congestion: Secondary | ICD-10-CM | POA: Diagnosis not present

## 2021-02-03 DIAGNOSIS — R051 Acute cough: Secondary | ICD-10-CM | POA: Insufficient documentation

## 2021-02-03 DIAGNOSIS — R6883 Chills (without fever): Secondary | ICD-10-CM | POA: Diagnosis not present

## 2021-02-03 LAB — VERITOR FLU A/B WAIVED
Influenza A: NEGATIVE
Influenza B: NEGATIVE

## 2021-02-03 MED ORDER — OSELTAMIVIR PHOSPHATE 75 MG PO CAPS: 75.0000 mg | ORAL_CAPSULE | Freq: Every day | ORAL | 0 refills | Status: DC

## 2021-02-03 MED ORDER — FEXOFENADINE HCL 180 MG PO TABS: 180.0000 mg | ORAL_TABLET | Freq: Every day | ORAL | 1 refills | Status: AC

## 2021-02-03 MED ORDER — BENZONATATE 100 MG PO CAPS: 100.0000 mg | ORAL_CAPSULE | Freq: Two times a day (BID) | ORAL | 0 refills | Status: DC | PRN

## 2021-02-03 NOTE — Telephone Encounter (Signed)
You saw her today

## 2021-02-03 NOTE — Progress Notes (Signed)
BP 112/78   Pulse (!) 109   Temp 98.5 F (36.9 C) (Oral)   Ht 5' 1.02" (1.55 m)   Wt 218 lb 6.4 oz (99.1 kg)   BMI 41.23 kg/m    Subjective:    Patient ID: Evelyn Webster, female    DOB: 1980/09/11, 40 y.o.   MRN: [id number]  Chief Complaint  Patient presents with   Cough    Patient states that she has had a sore throat, cough, chest congestion, nasal drainage, body aches and chills All started Yesterday     HPI: Evelyn Webster is a 40 y.o. female  Cough This is a new problem. The current episode started 1 to 4 weeks ago. Associated symptoms include chills, myalgias, nasal congestion and a sore throat. Pertinent negatives include no chest pain, ear congestion, ear pain, fever, headaches, heartburn, hemoptysis, postnasal drip, rash, rhinorrhea, shortness of breath, sweats, weight loss or wheezing. Associated symptoms comments: 99.6 temp.   Chief Complaint  Patient presents with   Cough    Patient states that she has had a sore throat, cough, chest congestion, nasal drainage, body aches and chills All started Yesterday     Relevant past medical, surgical, family and social history reviewed and updated as indicated. Interim medical history since our last visit reviewed. Allergies and medications reviewed and updated.  Review of Systems  Constitutional:  Positive for chills. Negative for fever and weight loss.  HENT:  Positive for sore throat. Negative for ear pain, postnasal drip and rhinorrhea.   Respiratory:  Positive for cough. Negative for hemoptysis, shortness of breath and wheezing.   Cardiovascular:  Negative for chest pain.  Gastrointestinal:  Negative for heartburn.  Musculoskeletal:  Positive for myalgias.  Skin:  Negative for rash.  Neurological:  Negative for headaches.   Per HPI unless specifically indicated above     Objective:    BP 112/78   Pulse (!) 109   Temp 98.5 F (36.9 C) (Oral)   Ht 5' 1.02" (1.55 m)   Wt 218 lb 6.4 oz (99.1 kg)   BMI  41.23 kg/m   Wt Readings from Last 3 Encounters:  02/03/21 218 lb 6.4 oz (99.1 kg)  12/29/20 221 lb 6.4 oz (100.4 kg)  12/23/20 222 lb 6.4 oz (100.9 kg)    Physical Exam Vitals and nursing note reviewed.  Constitutional:      General: She is not in acute distress.    Appearance: Normal appearance. She is not ill-appearing or diaphoretic.  Cardiovascular:     Rate and Rhythm: Normal rate and regular rhythm.     Heart sounds: No murmur heard. Pulmonary:     Effort: Pulmonary effort is normal. No respiratory distress.     Breath sounds: Normal breath sounds. No stridor. No wheezing, rhonchi or rales.  Skin:    General: Skin is warm and dry.     Coloration: Skin is not jaundiced.     Findings: No erythema.  Neurological:     Mental Status: She is alert.    Results for orders placed or performed in visit on 02/03/21  Veritor Flu A/B Waived  Result Value Ref Range   Influenza A Negative Negative   Influenza B Negative Negative        Current Outpatient Medications:    acetaminophen (TYLENOL) 325 MG tablet, Take 2 tablets (650 mg total) by mouth every 6 (six) hours as needed for mild pain (or Fever >/= 101)., Disp: 10 tablet, Rfl: 0  albuterol (VENTOLIN HFA) 108 (90 Base) MCG/ACT inhaler, Inhale 2 puffs into the lungs every 6 (six) hours as needed for wheezing or shortness of breath., Disp: 18 g, Rfl: 6   benzonatate (TESSALON) 100 MG capsule, Take 1 capsule (100 mg total) by mouth 2 (two) times daily as needed for cough., Disp: 20 capsule, Rfl: 0   chlorhexidine (HIBICLENS) 4 % external liquid, Apply topically daily as needed., Disp: 120 mL, Rfl: 12   cyclobenzaprine (FLEXERIL) 10 MG tablet, Take 1 tablet (10 mg total) by mouth at bedtime., Disp: 30 tablet, Rfl: 1   diclofenac Sodium (VOLTAREN) 1 % GEL, Apply 4 g topically 4 (four) times daily., Disp: 50 g, Rfl: 1   escitalopram (LEXAPRO) 20 MG tablet, TAKE 1 TABLET(20 MG) BY MOUTH AT BEDTIME, Disp: 90 tablet, Rfl: 1    fexofenadine (ALLEGRA ALLERGY) 180 MG tablet, Take 1 tablet (180 mg total) by mouth daily., Disp: 10 tablet, Rfl: 1   metFORMIN (GLUCOPHAGE-XR) 500 MG 24 hr tablet, Take 1 tablet (500 mg total) by mouth daily with breakfast., Disp: 30 tablet, Rfl: 3   montelukast (SINGULAIR) 10 MG tablet, Take 1 tablet (10 mg total) by mouth at bedtime., Disp: 90 tablet, Rfl: 1   naproxen (NAPROSYN) 500 MG tablet, Take 1 tablet (500 mg total) by mouth 2 (two) times daily with a meal., Disp: 60 tablet, Rfl: 2   OLANZapine zydis (ZYPREXA) 5 MG disintegrating tablet, Take 1 tablet (5 mg total) by mouth at bedtime., Disp: 90 tablet, Rfl: 1   Omega-3 Fatty Acids (FISH OIL PO), Take 1 Dose by mouth as directed., Disp: , Rfl:    omeprazole (PRILOSEC) 40 MG capsule, Take 1 capsule (40 mg total) by mouth daily., Disp: 90 capsule, Rfl: 3   oseltamivir (TAMIFLU) 75 MG capsule, Take 1 capsule (75 mg total) by mouth daily for 10 days., Disp: 10 capsule, Rfl: 0   PROCTOZONE-HC 2.5 % rectal cream, APPLY RECTALLY TO THE AFFECTED AREA TWICE DAILY (Patient not taking: Reported on 12/23/2020), Disp: 30 g, Rfl: 0   umeclidinium-vilanterol (ANORO ELLIPTA) 62.5-25 MCG/INH AEPB, Inhale 1 puff into the lungs daily. (Patient not taking: Reported on 02/03/2021), Disp: 30 each, Rfl: 6    Assessment & Plan:  URI: Flu/ and strep ordered at this visit, both negative. COVID  PENDING pt advised to take Tylenol q 4- 6 hourly as needed. pt to take allegra q pm as needed and to call office if symptoms worsened pt verbalised understanding of such.   Problem List Items Addressed This Visit   None Visit Diagnoses     Acute cough    -  Primary   Relevant Orders   Novel Coronavirus, NAA ([facility])   Veritor Flu A/B Waived (Completed)   Nasal congestion       Relevant Orders   Novel Coronavirus, NAA ([facility])   Veritor Flu A/B Waived (Completed)   Chills       Relevant Orders   Novel Coronavirus, NAA ([facility])   Veritor Flu A/B Waived  (Completed)        Orders Placed This Encounter  Procedures   Novel Coronavirus, NAA ([facility])   Veritor Flu A/B Waived     Meds ordered this encounter  Medications   fexofenadine (ALLEGRA ALLERGY) 180 MG tablet    Sig: Take 1 tablet (180 mg total) by mouth daily.    Dispense:  10 tablet    Refill:  1   oseltamivir (TAMIFLU) 75 MG capsule    Sig:  Take 1 capsule (75 mg total) by mouth daily for 10 days.    Dispense:  10 capsule    Refill:  0   benzonatate (TESSALON) 100 MG capsule    Sig: Take 1 capsule (100 mg total) by mouth 2 (two) times daily as needed for cough.    Dispense:  20 capsule    Refill:  0     Follow up plan: No follow-ups on file.

## 2021-02-03 NOTE — Telephone Encounter (Signed)
Copied from CRM [id number]. Topic: General - Other >> Feb 03, 2021 11:42 AM McGill, Darlina Rumpf wrote: Reason for CRM: pt requesting a work note. Asking have letter extended until 02/07/21 when her COVID test comes back.   Please advise.

## 2021-02-03 NOTE — Telephone Encounter (Signed)
Pl let the pt know we can extend her time off  till tomoorow, we should have the results then. Ok to send a letter having her off tomorrow.  Thnx.

## 2021-02-03 NOTE — Telephone Encounter (Signed)
Letter in her chart

## 2021-02-04 LAB — NOVEL CORONAVIRUS, NAA: SARS-CoV-2, NAA: NOT DETECTED

## 2021-02-04 NOTE — Telephone Encounter (Signed)
Patient called back asking can she pick up Dr's note tomorrow, She doesn't have access to [employer]

## 2021-02-07 NOTE — Telephone Encounter (Signed)
Called patient but no answer to let her know that her not is ready to be picked up in the office.

## 2021-02-11 ENCOUNTER — Encounter: Payer: Self-pay | Admitting: Family Medicine

## 2021-02-11 ENCOUNTER — Ambulatory Visit (INDEPENDENT_AMBULATORY_CARE_PROVIDER_SITE_OTHER): Payer: PRIVATE HEALTH INSURANCE | Admitting: Family Medicine

## 2021-02-11 ENCOUNTER — Other Ambulatory Visit: Payer: Self-pay

## 2021-02-11 VITALS — BP 113/79 | HR 78 | Temp 97.9°F | Wt 218.0 lb

## 2021-02-11 DIAGNOSIS — J449 Chronic obstructive pulmonary disease, unspecified: Secondary | ICD-10-CM | POA: Diagnosis not present

## 2021-02-11 DIAGNOSIS — R0981 Nasal congestion: Secondary | ICD-10-CM | POA: Diagnosis not present

## 2021-02-11 DIAGNOSIS — E1165 Type 2 diabetes mellitus with hyperglycemia: Secondary | ICD-10-CM | POA: Diagnosis not present

## 2021-02-11 DIAGNOSIS — Z23 Encounter for immunization: Secondary | ICD-10-CM

## 2021-02-11 DIAGNOSIS — B070 Plantar wart: Secondary | ICD-10-CM | POA: Diagnosis not present

## 2021-02-11 LAB — MICROALBUMIN, URINE WAIVED
Creatinine, Urine Waived: 300 mg/dL (ref 10–300)
Microalb, Ur Waived: 150 mg/L — ABNORMAL HIGH (ref 0–19)

## 2021-02-11 LAB — BAYER DCA HB A1C WAIVED: HB A1C (BAYER DCA - WAIVED): 6.4 % — ABNORMAL HIGH (ref 4.8–5.6)

## 2021-02-11 MED ORDER — MONTELUKAST SODIUM 10 MG PO TABS
10.0000 mg | ORAL_TABLET | Freq: Every day | ORAL | 1 refills | Status: AC
Start: 2021-02-11 — End: ?

## 2021-02-11 MED ORDER — METFORMIN HCL ER 500 MG PO TB24: 500.0000 mg | ORAL_TABLET | Freq: Every day | ORAL | 1 refills | Status: AC

## 2021-02-11 MED ORDER — ALBUTEROL SULFATE HFA 108 (90 BASE) MCG/ACT IN AERS: 2.0000 | INHALATION_SPRAY | Freq: Four times a day (QID) | RESPIRATORY_TRACT | 6 refills | Status: AC | PRN

## 2021-02-11 MED ORDER — PREDNISONE 50 MG PO TABS: 50.0000 mg | ORAL_TABLET | Freq: Every day | ORAL | 0 refills | Status: DC

## 2021-02-11 MED ORDER — OLANZAPINE 5 MG PO TBDP: 5.0000 mg | ORAL_TABLET | Freq: Every day | ORAL | 1 refills | Status: DC

## 2021-02-11 MED ORDER — UMECLIDINIUM-VILANTEROL 62.5-25 MCG/ACT IN AEPB: 1.0000 | INHALATION_SPRAY | Freq: Every day | RESPIRATORY_TRACT | 3 refills | Status: DC

## 2021-02-11 MED ORDER — ESCITALOPRAM OXALATE 20 MG PO TABS: ORAL_TABLET | ORAL | 1 refills | Status: AC

## 2021-02-11 NOTE — Assessment & Plan Note (Signed)
Will treat with burst of steroids. Call with any concerns or if not getting better.

## 2021-02-11 NOTE — Progress Notes (Signed)
BP 113/79   Pulse 78   Temp 97.9 F (36.6 C)   Wt 218 lb (98.9 kg)   SpO2 98%   BMI 41.16 kg/m    Subjective:    Patient ID: Evelyn Webster, female    DOB: December 26, 1980, 40 y.o.   MRN: [id number]  HPI: Evelyn Webster is a 40 y.o. female  Chief Complaint  Patient presents with   Cough    Patient was seen for cough last week, cough is still persistent. Patient states she coughs up clear mucus.    Depression   Diabetes   UPPER RESPIRATORY TRACT INFECTION Duration: 9 days Worst symptom:cough Fever: no Cough: yes Shortness of breath: no Wheezing: no Chest pain: no Chest tightness: no Chest congestion: no Nasal congestion: no Runny nose: yes Post nasal drip: yes Sneezing: no Sore throat: yes Swollen glands: no Sinus pressure: yes Headache: yes Face pain: no Toothache: no Ear pain: no  Ear pressure: no  Eyes red/itching:no Eye drainage/crusting: no  Vomiting: no Rash: no Fatigue: yes Sick contacts: yes Strep contacts: no  Context: better Recurrent sinusitis: no Relief with OTC cold/cough medications: no  Treatments attempted:  cough medicine and anti-histamine   SKIN LESION Duration: weeks Location: R foot Painful: yes Itching: no Onset: sudden Context: not changing History of skin cancer: no  DIABETES Hypoglycemic episodes:no Polydipsia/polyuria: no Visual disturbance: no Chest pain: no Paresthesias: no Glucose Monitoring: no  Accucheck frequency: Not Checking Taking Insulin?: no Blood Pressure Monitoring: not checking Retinal Examination: Not up to Date Foot Exam: Up to Date Diabetic Education: Not Completed Pneumovax: Up to Date  Influenza: Given today Aspirin: no,  DEPRESSION Mood status: controlled Satisfied with current treatment?: yes Symptom severity: mild  Duration of current treatment : chronic Side effects: no Medication compliance: excellent compliance Psychotherapy/counseling: no  Previous psychiatric medications:  lexapro and zyprexa Depressed mood: no Anxious mood: no Anhedonia: no Significant weight loss or gain: no Insomnia: no  Fatigue: no Feelings of worthlessness or guilt: no Impaired concentration/indecisiveness: no Suicidal ideations: no Hopelessness: no Crying spells: no Depression screen [facility] 2/9 02/11/2021 08/24/2020 07/23/2020 07/19/2020 04/20/2020  Decreased Interest 1 0 0 0 0  Down, Depressed, Hopeless 0 0 0 0 0  PHQ - 2 Score 1 0 0 0 0  Altered sleeping 0 0 0 1 -  Tired, decreased energy 3 0 0 1 -  Change in appetite 0 0 0 0 -  Feeling bad or failure about yourself  0 0 0 0 -  Trouble concentrating 0 0 0 0 -  Moving slowly or fidgety/restless 0 0 0 0 -  Suicidal thoughts 0 0 0 0 -  PHQ-9 Score 4 0 0 2 -  Difficult doing work/chores - Not difficult at all - Not difficult at all -  Some recent data might be hidden   GAD 7 : Generalized Anxiety Score 02/11/2021 08/24/2020 07/23/2020 07/19/2020  Nervous, Anxious, on Edge 3 1 2 2   Control/stop worrying 1 1 2 2   Worry too much - different things 1 1 2 2   Trouble relaxing 2 1 0 2  Restless 0 1 0 2  Easily annoyed or irritable 2 1 0 2  Afraid - awful might happen 0 1 0 2  Total GAD 7 Score 9 7 6 14   Anxiety Difficulty - Somewhat difficult Not difficult at all Somewhat difficult      Relevant past medical, surgical, family and social history reviewed and updated as indicated. Interim medical history since  our last visit reviewed. Allergies and medications reviewed and updated.  Review of Systems  Constitutional:  Positive for fatigue. Negative for activity change, appetite change, chills, diaphoresis, fever and unexpected weight change.  HENT:  Positive for congestion and sinus pressure. Negative for dental problem, drooling, ear discharge, ear pain, facial swelling, hearing loss, mouth sores, nosebleeds, postnasal drip, rhinorrhea, sinus pain, sneezing, sore throat, tinnitus, trouble swallowing and voice change.   Respiratory:   Positive for cough and chest tightness. Negative for apnea, choking, shortness of breath, wheezing and stridor.   Cardiovascular: Negative.   Gastrointestinal: Negative.   Skin: Negative.   Psychiatric/Behavioral: Negative.     Per HPI unless specifically indicated above     Objective:    BP 113/79   Pulse 78   Temp 97.9 F (36.6 C)   Wt 218 lb (98.9 kg)   SpO2 98%   BMI 41.16 kg/m   Wt Readings from Last 3 Encounters:  02/11/21 218 lb (98.9 kg)  02/03/21 218 lb 6.4 oz (99.1 kg)  12/29/20 221 lb 6.4 oz (100.4 kg)    Physical Exam Vitals and nursing note reviewed.  Constitutional:      General: She is not in acute distress.    Appearance: Normal appearance. She is not ill-appearing, toxic-appearing or diaphoretic.  HENT:     Head: Normocephalic and atraumatic.     Right Ear: External ear normal.     Left Ear: External ear normal.     Nose: Nose normal.     Mouth/Throat:     Mouth: Mucous membranes are moist.     Pharynx: Oropharynx is clear.  Eyes:     General: No scleral icterus.       Right eye: No discharge.        Left eye: No discharge.     Extraocular Movements: Extraocular movements intact.     Conjunctiva/sclera: Conjunctivae normal.     Pupils: Pupils are equal, round, and reactive to light.  Cardiovascular:     Rate and Rhythm: Normal rate and regular rhythm.     Pulses: Normal pulses.     Heart sounds: Normal heart sounds. No murmur heard.   No friction rub. No gallop.  Pulmonary:     Effort: Pulmonary effort is normal. No respiratory distress.     Breath sounds: Normal breath sounds. No stridor. No wheezing, rhonchi or rales.  Chest:     Chest wall: No tenderness.  Musculoskeletal:        General: Normal range of motion.     Cervical back: Normal range of motion and neck supple.  Skin:    General: Skin is warm and dry.     Capillary Refill: Capillary refill takes less than 2 seconds.     Coloration: Skin is not jaundiced or pale.     Findings:  No bruising, erythema, lesion or rash.  Neurological:     General: No focal deficit present.     Mental Status: She is alert and oriented to person, place, and time. Mental status is at baseline.  Psychiatric:        Mood and Affect: Mood normal.        Behavior: Behavior normal.        Thought Content: Thought content normal.        Judgment: Judgment normal.    Results for orders placed or performed in visit on 02/11/21  Bayer DCA Hb A1c Waived  Result Value Ref Range   HB A1C (  BAYER DCA - WAIVED) 6.4 (H) 4.8 - 5.6 %  Microalbumin, Urine Waived  Result Value Ref Range   Microalb, Ur Waived 150 (H) 0 - 19 mg/L   Creatinine, Urine Waived 300 10 - 300 mg/dL   Microalb/Creat Ratio 30-300 (H) <30 mg/g      Assessment & Plan:   Problem List Items Addressed This Visit       Respiratory   Chronic obstructive pulmonary disease (HCC)    Encouraged her to restart her anoro. Rx sent to her pharmacy.      Relevant Medications   predniSONE (DELTASONE) 50 MG tablet   albuterol (VENTOLIN HFA) 108 (90 Base) MCG/ACT inhaler   montelukast (SINGULAIR) 10 MG tablet   umeclidinium-vilanterol (ANORO ELLIPTA) 62.5-25 MCG/ACT AEPB     Endocrine   Type 2 diabetes mellitus with hyperglycemia, without long-term current use of insulin (HCC)    Doing well with A1c of 6.4. Continue current regimen. Continue to monitor. Referral to nutrition and eye doctor made. Call with any concerns.       Relevant Medications   metFORMIN (GLUCOPHAGE-XR) 500 MG 24 hr tablet   Other Relevant Orders   Comprehensive metabolic panel   CBC with Differential/Platelet   Bayer DCA Hb A1c Waived (Completed)   Lipid Panel w/o Chol/HDL Ratio   Microalbumin, Urine Waived (Completed)   Ambulatory referral to Ophthalmology   Ambulatory referral to diabetic education     Other   Nasal congestion - Primary    Will treat with burst of steroids. Call with any concerns or if not getting better.      Other Visit  Diagnoses     Plantar wart       Referral to podiatry made today.   Relevant Orders   Ambulatory referral to Podiatry   Needs flu shot       Relevant Orders   Flu Vaccine QUAD 6+ mos PF IM (Fluarix Quad PF)        Follow up plan: Return OK to cancel appt on Monday and reschedule for 6 months physical.

## 2021-02-11 NOTE — Assessment & Plan Note (Signed)
Doing well with A1c of 6.4. Continue current regimen. Continue to monitor. Referral to nutrition and eye doctor made. Call with any concerns.

## 2021-02-11 NOTE — Assessment & Plan Note (Signed)
Encouraged her to restart her anoro. Rx sent to her pharmacy.

## 2021-02-12 LAB — CBC WITH DIFFERENTIAL/PLATELET
Basophils Absolute: 0 10*3/uL (ref 0.0–0.2)
Basos: 0 %
EOS (ABSOLUTE): 0.2 10*3/uL (ref 0.0–0.4)
Eos: 3 %
Hematocrit: 36.5 % (ref 34.0–46.6)
Hemoglobin: 12.3 g/dL (ref 11.1–15.9)
Immature Grans (Abs): 0 10*3/uL (ref 0.0–0.1)
Immature Granulocytes: 0 %
Lymphocytes Absolute: 2.9 10*3/uL (ref 0.7–3.1)
Lymphs: 32 %
MCH: 28.9 pg (ref 26.6–33.0)
MCHC: 33.7 g/dL (ref 31.5–35.7)
MCV: 86 fL (ref 79–97)
Monocytes Absolute: 0.6 10*3/uL (ref 0.1–0.9)
Monocytes: 7 %
Neutrophils Absolute: 5.2 10*3/uL (ref 1.4–7.0)
Neutrophils: 58 %
Platelets: 310 10*3/uL (ref 150–450)
RBC: 4.26 x10E6/uL (ref 3.77–5.28)
RDW: 13.4 % (ref 11.7–15.4)
WBC: 9 10*3/uL (ref 3.4–10.8)

## 2021-02-12 LAB — COMPREHENSIVE METABOLIC PANEL
ALT: 12 IU/L (ref 0–32)
AST: 13 IU/L (ref 0–40)
Albumin/Globulin Ratio: 1.5 (ref 1.2–2.2)
Albumin: 4 g/dL (ref 3.8–4.8)
Alkaline Phosphatase: 99 IU/L (ref 44–121)
BUN/Creatinine Ratio: 11 (ref 9–23)
BUN: 8 mg/dL (ref 6–24)
Bilirubin Total: 0.3 mg/dL (ref 0.0–1.2)
CO2: 21 mmol/L (ref 20–29)
Calcium: 8.8 mg/dL (ref 8.7–10.2)
Chloride: 105 mmol/L (ref 96–106)
Creatinine, Ser: 0.76 mg/dL (ref 0.57–1.00)
Globulin, Total: 2.7 g/dL (ref 1.5–4.5)
Glucose: 139 mg/dL — ABNORMAL HIGH (ref 70–99)
Potassium: 3.9 mmol/L (ref 3.5–5.2)
Sodium: 140 mmol/L (ref 134–144)
Total Protein: 6.7 g/dL (ref 6.0–8.5)
eGFR: 102 mL/min/{1.73_m2} (ref >59)

## 2021-02-12 LAB — LIPID PANEL W/O CHOL/HDL RATIO
Cholesterol, Total: 169 mg/dL (ref 100–199)
HDL: 34 mg/dL — ABNORMAL LOW (ref >39)
LDL Chol Calc (NIH): 104 mg/dL — ABNORMAL HIGH (ref 0–99)
Triglycerides: 179 mg/dL — ABNORMAL HIGH (ref 0–149)
VLDL Cholesterol Cal: 31 mg/dL (ref 5–40)

## 2021-02-14 ENCOUNTER — Ambulatory Visit: Payer: PRIVATE HEALTH INSURANCE | Admitting: Family Medicine

## 2021-02-23 ENCOUNTER — Ambulatory Visit (INDEPENDENT_AMBULATORY_CARE_PROVIDER_SITE_OTHER): Payer: Self-pay | Admitting: Podiatry

## 2021-02-23 DIAGNOSIS — Z91199 Patient's noncompliance with other medical treatment and regimen due to unspecified reason: Secondary | ICD-10-CM

## 2021-02-24 NOTE — Progress Notes (Signed)
Patient was no-show for appointment today 

## 2021-03-03 ENCOUNTER — Other Ambulatory Visit: Payer: Self-pay | Admitting: Family Medicine

## 2021-03-03 NOTE — Telephone Encounter (Signed)
Rx refilled 02/11/2021 #90 with 1 refill. Pt should have a 5 months supply.  Requested Prescriptions  Pending Prescriptions Disp Refills   metFORMIN (GLUCOPHAGE-XR) 500 MG 24 hr tablet [Pharmacy Med Name: METFORMIN ER 500MG 24HR TABS] 30 tablet     Sig: TAKE 1 TABLET(500 MG) BY MOUTH DAILY WITH BREAKFAST     Endocrinology:  Diabetes - Biguanides Passed - 03/03/2021  3:33 AM      Passed - Cr in normal range and within 360 days    Creatinine, Ser  Date Value Ref Range Status  02/11/2021 0.76 0.57 - 1.00 mg/dL Final         Passed - HBA1C is between 0 and 7.9 and within 180 days    HB A1C (BAYER DCA - WAIVED)  Date Value Ref Range Status  02/11/2021 6.4 (H) 4.8 - 5.6 % Final    Comment:             Prediabetes: 5.7 - 6.4          Diabetes: >6.4          Glycemic control for adults with diabetes: <7.0          Passed - eGFR in normal range and within 360 days    GFR calc Af Amer  Date Value Ref Range Status  04/21/2019 116 >59 mL/min/1.73 Final   GFR calc non Af Amer  Date Value Ref Range Status  04/21/2019 101 >59 mL/min/1.73 Final   eGFR  Date Value Ref Range Status  02/11/2021 102 >59 mL/min/1.73 Final         Passed - Valid encounter within last 6 months    Recent Outpatient Visits          2 weeks ago Nasal congestion   [place], Megan P, DO   4 weeks ago Acute cough   [facility] Vigg, Avanti, MD   2 months ago Pain of finger of left hand   [facility] Jon Billings, NP   2 months ago Chronic GERD   [place], Lauren A, NP   3 months ago Abscess   [place], [place], DO      Future Appointments            In 5 months Wynetta Emery, Barb Merino, DO [employer]

## 2021-03-11 ENCOUNTER — Ambulatory Visit: Payer: PRIVATE HEALTH INSURANCE | Admitting: Podiatry

## 2021-03-14 ENCOUNTER — Ambulatory Visit (INDEPENDENT_AMBULATORY_CARE_PROVIDER_SITE_OTHER): Payer: PRIVATE HEALTH INSURANCE | Admitting: Nurse Practitioner

## 2021-03-14 ENCOUNTER — Other Ambulatory Visit: Payer: Self-pay

## 2021-03-14 ENCOUNTER — Encounter: Payer: Self-pay | Admitting: Nurse Practitioner

## 2021-03-14 VITALS — BP 101/71 | HR 84 | Temp 97.6°F | Wt 214.8 lb

## 2021-03-14 DIAGNOSIS — R051 Acute cough: Secondary | ICD-10-CM | POA: Diagnosis not present

## 2021-03-14 DIAGNOSIS — J069 Acute upper respiratory infection, unspecified: Secondary | ICD-10-CM

## 2021-03-14 LAB — VERITOR FLU A/B WAIVED
Influenza A: NEGATIVE
Influenza B: NEGATIVE
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
## 2021-03-14 NOTE — Patient Instructions (Signed)
Your symptoms and exam findings are most consistent with a viral upper respiratory infection. These usually run their course in 5-7 days. Unfortunately, antibiotics don't work against viruses and just increase your risk of other issues such as diarrhea, yeast infections, and resistant infections.  If you start feeling worse with facial pain, high fever, cough, shortness of breath or start feeling significantly worse, please call us right away to be further evaluated.  Some things that can make you feel better are: - Increased rest - Increasing Fluids - Acetaminophen / ibuprofen as needed for fever/pain.  - Salt water gargling, chloraseptic spray and throat lozenges - OTC pseudoephedrine or coricidin if you have a history of high blood pressure or take blood pressure medications - Mucinex.  - Saline sinus flushes or a neti pot.  - Humidifying the air.  

## 2021-03-16 LAB — NOVEL CORONAVIRUS, NAA: SARS-CoV-2, NAA: DETECTED — AB

## 2021-03-16 LAB — SARS-COV-2, NAA 2 DAY TAT

## 2021-03-16 MED ORDER — MOLNUPIRAVIR EUA 200MG CAPSULE: 4.0000 | ORAL_CAPSULE | Freq: Two times a day (BID) | ORAL | 0 refills | Status: AC

## 2021-03-16 NOTE — Addendum Note (Signed)
Addended by: Rodman Pickle A on: 03/16/2021 04:54 PM   Modules accepted: Orders

## 2021-03-29 ENCOUNTER — Encounter: Payer: Self-pay | Admitting: Family Medicine

## 2021-03-29 ENCOUNTER — Other Ambulatory Visit: Payer: Self-pay

## 2021-03-29 ENCOUNTER — Ambulatory Visit (INDEPENDENT_AMBULATORY_CARE_PROVIDER_SITE_OTHER): Payer: PRIVATE HEALTH INSURANCE | Admitting: Family Medicine

## 2021-03-29 VITALS — BP 135/85 | HR 116 | Temp 98.1°F | Wt 214.6 lb

## 2021-03-29 DIAGNOSIS — F410 Panic disorder [episodic paroxysmal anxiety] without agoraphobia: Secondary | ICD-10-CM

## 2021-03-29 DIAGNOSIS — R0789 Other chest pain: Secondary | ICD-10-CM | POA: Diagnosis not present

## 2021-03-29 DIAGNOSIS — R1012 Left upper quadrant pain: Secondary | ICD-10-CM

## 2021-03-29 DIAGNOSIS — F411 Generalized anxiety disorder: Secondary | ICD-10-CM

## 2021-03-29 LAB — URINALYSIS, ROUTINE W REFLEX MICROSCOPIC
Bilirubin, UA: NEGATIVE
Ketones, UA: NEGATIVE
Leukocytes,UA: NEGATIVE
Nitrite, UA: NEGATIVE
Protein,UA: NEGATIVE
RBC, UA: NEGATIVE
Specific Gravity, UA: 1.01 (ref 1.005–1.030)
Urobilinogen, Ur: 0.2 mg/dL (ref 0.2–1.0)
pH, UA: 5.5 (ref 5.0–7.5)

## 2021-03-29 MED ORDER — TIZANIDINE HCL 4 MG PO TABS: 4.0000 mg | ORAL_TABLET | Freq: Four times a day (QID) | ORAL | 0 refills | Status: AC | PRN

## 2021-03-29 MED ORDER — OLANZAPINE 5 MG PO TBDP: 5.0000 mg | ORAL_TABLET | Freq: Every day | ORAL | 1 refills | Status: AC

## 2021-03-29 NOTE — Assessment & Plan Note (Signed)
Has lost both of her parents in a 3 month period. Found her father dead 4 days ago. Will increase her olanzapine to 5-10mg  daily and recheck 3 weeks. Consider grief counseling.
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
## 2021-03-29 NOTE — Progress Notes (Signed)
Interpreted by me today. NSR at 95bpm. No ST segment changes.

## 2021-04-02 ENCOUNTER — Other Ambulatory Visit: Payer: Self-pay | Admitting: Family Medicine

## 2021-04-02 NOTE — Telephone Encounter (Signed)
Requested medications are due for refill today.  no  Requested medications are on the active medications list.  yes  Last refill. 03/29/2021 #30  Future visit scheduled.   yes  Notes to clinic.  Refilled 03/29/2021.    Requested Prescriptions  Pending Prescriptions Disp Refills   tiZANidine (ZANAFLEX) 4 MG tablet [Pharmacy Med Name: TIZANIDINE 4MG  TABLETS] 30 tablet 0    Sig: TAKE 1 TABLET(4 MG) BY MOUTH EVERY 6 HOURS AS NEEDED FOR MUSCLE SPASMS     Not Delegated - Cardiovascular:  Alpha-2 [facility]onists - tizanidine Failed - 04/02/2021  3:32 AM      Failed - This refill cannot be delegated      Passed - Valid encounter within last 6 months    Recent Outpatient Visits           4 days ago Left upper quadrant abdominal pain   [facility], Megan P, DO   2 weeks ago Upper respiratory tract infection, unspecified type   [facility] McElwee, Lauren A, NP   1 month ago Nasal congestion   [facility] Beecher, Megan P, DO   1 month ago Acute cough   [facility] Vigg, Avanti, MD   3 months ago Pain of finger of left hand   [facility], NP       Future Appointments             In 2 weeks Larae Grooms, Laural Benes, DO [facility]   In 3 months [employer], Laural Benes, DO [facility], [facility]

## 2021-04-19 ENCOUNTER — Ambulatory Visit (INDEPENDENT_AMBULATORY_CARE_PROVIDER_SITE_OTHER): Payer: Self-pay | Admitting: Family Medicine

## 2021-04-19 ENCOUNTER — Encounter: Payer: Self-pay | Admitting: Family Medicine

## 2021-04-19 ENCOUNTER — Other Ambulatory Visit: Payer: Self-pay

## 2021-04-19 DIAGNOSIS — F411 Generalized anxiety disorder: Secondary | ICD-10-CM

## 2021-04-19 DIAGNOSIS — F410 Panic disorder [episodic paroxysmal anxiety] without agoraphobia: Secondary | ICD-10-CM

## 2021-04-19 NOTE — Assessment & Plan Note (Signed)
Doing much better. Continue current regimen. Continue to monitor. Recheck at follow up in May.

## 2021-04-19 NOTE — Progress Notes (Signed)
BP 120/80    Pulse 83    Temp 98.7 F (37.1 C) (Oral)    Wt 216 lb 12.8 oz (98.3 kg)    LMP 04/07/2021 (Approximate)    BMI 40.93 kg/m    Subjective:    Patient ID: Evelyn Webster, female    DOB: 1980-09-20, 41 y.o.   MRN: [id number]  HPI: Makita Rivard is a 41 y.o. female  Chief Complaint  Patient presents with   Anxiety    3 week f/up    ANXIETY/DEPRESSION Duration: chronic Status:better Anxious mood: yes  Excessive worrying: no Irritability: no  Sweating: no Nausea: no Palpitations:no Hyperventilation: no Panic attacks: no Agoraphobia: no  Obscessions/compulsions: no Depressed mood: yes Depression screen [facility] 2/9 04/19/2021 03/29/2021 03/14/2021 02/11/2021 08/24/2020  Decreased Interest 0 0 0 1 0  Down, Depressed, Hopeless 0 0 0 0 0  PHQ - 2 Score 0 0 0 1 0  Altered sleeping 0 0 0 0 0  Tired, decreased energy 0 0 1 3 0  Change in appetite 0 0 0 0 0  Feeling bad or failure about yourself  0 0 0 0 0  Trouble concentrating 0 0 0 0 0  Moving slowly or fidgety/restless 0 0 0 0 0  Suicidal thoughts 0 0 0 0 0  PHQ-9 Score 0 0 1 4 0  Difficult doing work/chores Not difficult at all - Not difficult at all - Not difficult at all  Some recent data might be hidden   GAD 7 : Generalized Anxiety Score 04/19/2021 03/29/2021 03/14/2021 02/11/2021  Nervous, Anxious, on Edge 1 2 1 3   Control/stop worrying 2 2 1 1   Worry too much - different things 2 0 1 1  Trouble relaxing 0 0 0 2  Restless 0 0 0 0  Easily annoyed or irritable 0 0 0 2  Afraid - awful might happen 1 0 0 0  Total GAD 7 Score 6 4 3 9   Anxiety Difficulty Somewhat difficult - Not difficult at all -   Anhedonia: no Weight changes: no Insomnia: no   Hypersomnia: no Fatigue/loss of energy: yes Feelings of worthlessness: no Feelings of guilt: no Impaired concentration/indecisiveness: no Suicidal ideations: no  Crying spells: yes Recent Stressors/Life Changes: yes   Relationship problems: no   Family stress: yes      Financial stress: yes    Job stress: no    Recent death/loss: yes  Relevant past medical, surgical, family and social history reviewed and updated as indicated. Interim medical history since our last visit reviewed. Allergies and medications reviewed and updated.  Review of Systems  Constitutional: Negative.   Respiratory: Negative.    Cardiovascular: Negative.   Gastrointestinal: Negative.   Musculoskeletal: Negative.   Psychiatric/Behavioral: Negative.     Per HPI unless specifically indicated above     Objective:    BP 120/80    Pulse 83    Temp 98.7 F (37.1 C) (Oral)    Wt 216 lb 12.8 oz (98.3 kg)    LMP 04/07/2021 (Approximate)    BMI 40.93 kg/m   Wt Readings from Last 3 Encounters:  04/19/21 216 lb 12.8 oz (98.3 kg)  03/29/21 214 lb 9.6 oz (97.3 kg)  03/14/21 214 lb 12.8 oz (97.4 kg)    Physical Exam Vitals and nursing note reviewed.  Constitutional:      General: She is not in acute distress.    Appearance: Normal appearance. She is not ill-appearing, toxic-appearing or diaphoretic.  HENT:  Head: Normocephalic and atraumatic.     Right Ear: External ear normal.     Left Ear: External ear normal.     Nose: Nose normal.     Mouth/Throat:     Mouth: Mucous membranes are moist.     Pharynx: Oropharynx is clear.  Eyes:     General: No scleral icterus.       Right eye: No discharge.        Left eye: No discharge.     Extraocular Movements: Extraocular movements intact.     Conjunctiva/sclera: Conjunctivae normal.     Pupils: Pupils are equal, round, and reactive to light.  Cardiovascular:     Rate and Rhythm: Normal rate and regular rhythm.     Pulses: Normal pulses.     Heart sounds: Normal heart sounds. No murmur heard.   No friction rub. No gallop.  Pulmonary:     Effort: Pulmonary effort is normal. No respiratory distress.     Breath sounds: Normal breath sounds. No stridor. No wheezing, rhonchi or rales.  Chest:     Chest wall: No tenderness.   Musculoskeletal:        General: Normal range of motion.     Cervical back: Normal range of motion and neck supple.  Skin:    General: Skin is warm and dry.     Capillary Refill: Capillary refill takes less than 2 seconds.     Coloration: Skin is not jaundiced or pale.     Findings: No bruising, erythema, lesion or rash.  Neurological:     General: No focal deficit present.     Mental Status: She is alert and oriented to person, place, and time. Mental status is at baseline.  Psychiatric:        Mood and Affect: Mood normal.        Behavior: Behavior normal.        Thought Content: Thought content normal.        Judgment: Judgment normal.    Results for orders placed or performed in visit on 03/29/21  Urinalysis, Routine w reflex microscopic  Result Value Ref Range   Specific Gravity, UA 1.010 1.005 - 1.030   pH, UA 5.5 5.0 - 7.5   Color, UA Yellow Yellow   Appearance Ur Clear Clear   Leukocytes,UA Negative Negative   Protein,UA Negative Negative/Trace   Glucose, UA 3+ (A) Negative   Ketones, UA Negative Negative   RBC, UA Negative Negative   Bilirubin, UA Negative Negative   Urobilinogen, Ur 0.2 0.2 - 1.0 mg/dL   Nitrite, UA Negative Negative      Assessment & Plan:   Problem List Items Addressed This Visit       Other   Generalized anxiety disorder with panic attacks    Doing much better. Continue current regimen. Continue to monitor. Recheck at follow up in May.        Follow up plan: Return As scheduled.

## 2021-07-29 ENCOUNTER — Encounter: Payer: Self-pay | Admitting: Family Medicine

## 2021-08-12 ENCOUNTER — Encounter: Payer: PRIVATE HEALTH INSURANCE | Admitting: Family Medicine

## 2022-01-02 ENCOUNTER — Encounter (INDEPENDENT_AMBULATORY_CARE_PROVIDER_SITE_OTHER): Payer: Self-pay

## 2022-01-10 ENCOUNTER — Telehealth: Payer: Self-pay

## 2022-01-10 NOTE — Telephone Encounter (Signed)
Transition Care Management Unsuccessful Follow-up Telephone Call  Date of discharge and from where:  12/09/21, [place]  Attempts:  1st Attempt  Reason for unsuccessful TCM follow-up call:  Left voice message

## 2022-01-11 NOTE — Telephone Encounter (Signed)
Transition Care Management Unsuccessful Follow-up Telephone Call  Date of discharge and from where:  01/09/22, [facility]  Attempts:  3rd Attempt  Reason for unsuccessful TCM follow-up call:  Left voice message

## 2022-01-11 NOTE — Telephone Encounter (Signed)
Transition Care Management Unsuccessful Follow-up Telephone Call  Date of discharge and from where:  01/09/22, [facility]  Attempts:  2nd Attempt  Reason for unsuccessful TCM follow-up call:  Left voice message

## 2022-05-10 ENCOUNTER — Telehealth: Payer: Self-pay

## 2022-05-10 NOTE — Transitions of Care (Post Inpatient/ED Visit) (Signed)
   05/10/2022  Name: Evelyn Webster MRN: [id number] DOB: 1980-04-09  Today's TOC FU Call Status: Today's TOC FU Call Status:: Unsuccessul Call (1st Attempt) Unsuccessful Call (1st Attempt) Date: 05/10/22  Attempted to reach the patient regarding the most recent Inpatient/ED visit.  Follow Up Plan: Additional outreach attempts will be made to reach the patient to complete the Transitions of Care (Post Inpatient/ED visit) call.   Signature Irena Reichmann, [place]

## 2022-05-10 NOTE — Telephone Encounter (Signed)
-----   Message from Georgina Peer, [place] sent at 05/10/2022  8:03 AM EST ----- Needs TOC call completed please.

## 2022-05-15 NOTE — Telephone Encounter (Signed)
-----   Message from Brittany N Russell, CMA sent at 05/10/2022  8:03 AM EST ----- Needs TOC call completed please.   

## 2022-05-15 NOTE — Transitions of Care (Post Inpatient/ED Visit) (Signed)
   05/15/2022  Name: Evelyn Webster MRN: [id number] DOB: [birth year]  Today's TOC FU Call Status: Today's TOC FU Call Status:: Unsuccessul Call (1st Attempt) Unsuccessful Call (1st Attempt) Date: 05/10/22  Transition Care Management Follow-up Telephone Call    Items Reviewed:    Home Care and Equipment/Supplies:    Functional Questionnaire:    Folllow up appointments reviewed:      Eda Paschal, [place]

## 2022-06-07 ENCOUNTER — Telehealth: Payer: Self-pay

## 2022-06-07 NOTE — Telephone Encounter (Signed)
-----   Message from Georgina Peer, [place] sent at 06/07/2022 10:34 AM EDT ----- Needs TOC call completed please.

## 2022-06-07 NOTE — Transitions of Care (Post Inpatient/ED Visit) (Signed)
   06/07/2022  Name: Evelyn Webster MRN: [id number] DOB: [birth year]  Today's TOC FU Call Status: Today's TOC FU Call Status:: Unsuccessul Call (1st Attempt) Unsuccessful Call (1st Attempt) Date: 06/07/22  Attempted to reach the patient regarding the most recent Inpatient/ED visit.  Follow Up Plan: Additional outreach attempts will be made to reach the patient to complete the Transitions of Care (Post Inpatient/ED visit) call.   Signature  Irena Reichmann, [place]
